# Patient Record
Sex: Male | Born: 1990 | Race: Black or African American | Hispanic: No | Smoking: Current every day smoker
Health system: Southern US, Community
[De-identification: ages and names within clinical notes are randomized; demographics above are authoritative.]

## PROBLEM LIST (undated history)

## (undated) DIAGNOSIS — F909 Attention-deficit hyperactivity disorder, unspecified type: Secondary | ICD-10-CM

## (undated) DIAGNOSIS — T7840XA Allergy, unspecified, initial encounter: Secondary | ICD-10-CM

## (undated) DIAGNOSIS — J45909 Unspecified asthma, uncomplicated: Secondary | ICD-10-CM

## (undated) HISTORY — DX: Allergy, unspecified, initial encounter: T78.40XA

## (undated) HISTORY — DX: Unspecified asthma, uncomplicated: J45.909

---

## 2010-07-09 ENCOUNTER — Emergency Department (HOSPITAL_COMMUNITY): Admission: EM | Admit: 2010-07-09 | Discharge: 2010-07-09 | Payer: Self-pay | Admitting: Emergency Medicine

## 2010-07-20 ENCOUNTER — Emergency Department (HOSPITAL_COMMUNITY): Admission: EM | Admit: 2010-07-20 | Discharge: 2010-07-20 | Payer: Self-pay | Admitting: Emergency Medicine

## 2012-11-15 ENCOUNTER — Encounter (HOSPITAL_COMMUNITY): Payer: Self-pay | Admitting: Emergency Medicine

## 2012-11-15 ENCOUNTER — Emergency Department (HOSPITAL_COMMUNITY)
Admission: EM | Admit: 2012-11-15 | Discharge: 2012-11-15 | Disposition: A | Discharge status: Home / Self Care | Payer: BC Managed Care – PPO | Attending: Emergency Medicine | Admitting: Emergency Medicine

## 2012-11-15 DIAGNOSIS — F17200 Nicotine dependence, unspecified, uncomplicated: Secondary | ICD-10-CM | POA: Insufficient documentation

## 2012-11-15 DIAGNOSIS — Z79899 Other long term (current) drug therapy: Secondary | ICD-10-CM | POA: Insufficient documentation

## 2012-11-15 DIAGNOSIS — Z8659 Personal history of other mental and behavioral disorders: Secondary | ICD-10-CM | POA: Insufficient documentation

## 2012-11-15 DIAGNOSIS — K089 Disorder of teeth and supporting structures, unspecified: Secondary | ICD-10-CM | POA: Insufficient documentation

## 2012-11-15 HISTORY — DX: Attention-deficit hyperactivity disorder, unspecified type: F90.9

## 2012-11-15 MED ORDER — HYDROCODONE-ACETAMINOPHEN 5-325 MG PO TABS: 1.0000 | ORAL_TABLET | Freq: Four times a day (QID) | ORAL | Status: DC | PRN

## 2012-11-15 NOTE — ED Notes (Signed)
Pt c/o 10/10 dental pain x2 months that has worsened 2weeks.  Pt has hx of dental infection.  Reports that teeth are sensitive to eating and drinking.  Teeth are intact.

## 2012-11-15 NOTE — ED Provider Notes (Signed)
History     CSN: [id number]  Arrival date & time 11/15/12  1158   First MD Initiated Contact with Patient 11/15/12 1302      Chief Complaint  Patient presents with  . Dental Pain    (Consider location/radiation/quality/duration/timing/severity/associated sxs/prior treatment) HPI Henry Weber is a 22 y.o. male who presents to ED with complaint of pain to his teeth. States his wisdom teeth came in and pressing on the rest of the teeth in his mouth. States that went to school doctors office, was given amoxil. States has an apt with oral surgery on the 12th of march. Has been taking ibuprofen and tylenol with no relief. Denies any swelling of the face or gums. States pain unbearable since yesterday and no relief with over the counter medications. No other complaints.    Past Medical History  Diagnosis Date  . ADHD (attention deficit hyperactivity disorder)     History reviewed. No pertinent past surgical history.  History reviewed. No pertinent family history.  History  Substance Use Topics  . Smoking status: Current Every Day Smoker  . Smokeless tobacco: Not on file  . Alcohol Use: Yes      Review of Systems  Constitutional: Negative for fever and chills.  HENT: Positive for dental problem. Negative for nosebleeds, sore throat, neck pain and neck stiffness.   Respiratory: Negative.   Cardiovascular: Negative.     Allergies  Review of patient's allergies indicates no known allergies.  Home Medications   Current Outpatient Rx  Name  Route  Sig  Dispense  Refill  . albuterol (PROVENTIL HFA;VENTOLIN HFA) 108 (90 BASE) MCG/ACT inhaler   Inhalation   Inhale 2 puffs into the lungs every 6 (six) hours as needed for wheezing.         Marland Kitchen amoxicillin (AMOXIL) 500 MG capsule   Oral   Take 500 mg by mouth 3 (three) times daily. 10 day course of therapy started 11/14/12.         . ibuprofen (ADVIL,MOTRIN) 200 MG tablet   Oral   Take 600 mg by mouth every 8 (eight) hours  as needed for pain.           BP 126/69  Pulse 94  Temp(Src) 98.3 F (36.8 C) (Oral)  Resp 20  SpO2 97%  Physical Exam  Nursing note and vitals reviewed. Constitutional: He is oriented to person, place, and time. He appears well-developed and well-nourished. No distress.  HENT:  Head: Normocephalic and atraumatic.  Nose: Nose normal.  Mouth/Throat: Oropharynx is clear and moist.  Good dentition. No obvious carries. All 4 wisdom teeth out. Tender to palpation. No surrounding swelling of the gums. No facial swelling.   Eyes: Conjunctivae are normal.  Neck: Neck supple.  Cardiovascular: Normal rate, regular rhythm and normal heart sounds.   Pulmonary/Chest: Effort normal and breath sounds normal. No respiratory distress. He has no wheezes. He has no rales.  Musculoskeletal: He exhibits no edema.  Neurological: He is alert and oriented to person, place, and time.  Skin: Skin is warm and dry.    ED Course  Procedures (including critical care time)  Labs Reviewed - No data to display No results found.   1. Pain, dental       MDM  Pt with dental pain, suspect pain from wisdom tooth pressure. Apt not until march 12th. Will give a referral to oral surgeon on call. Pt already on antibiotics. Will add pain medication. Follow up as needed.  Lottie Mussel, PA 11/15/12 1625

## 2012-11-18 NOTE — ED Provider Notes (Signed)
Medical screening examination/treatment/procedure(s) were performed by non-physician practitioner and as supervising physician I was immediately available for consultation/collaboration.  Gilda Crease, MD 11/18/12 1340

## 2014-02-27 ENCOUNTER — Ambulatory Visit (INDEPENDENT_AMBULATORY_CARE_PROVIDER_SITE_OTHER): Payer: BC Managed Care – PPO | Admitting: Emergency Medicine

## 2014-02-27 VITALS — BP 122/74 | HR 100 | Temp 98.0°F | Resp 18 | Ht 69.0 in | Wt 162.0 lb

## 2014-02-27 DIAGNOSIS — J45909 Unspecified asthma, uncomplicated: Secondary | ICD-10-CM

## 2014-02-27 MED ORDER — ALBUTEROL SULFATE HFA 108 (90 BASE) MCG/ACT IN AERS: 2.0000 | INHALATION_SPRAY | RESPIRATORY_TRACT | Status: AC | PRN

## 2014-02-27 NOTE — Patient Instructions (Signed)
Asthma, Adult Asthma is a recurring condition in which the airways tighten and narrow. Asthma can make it difficult to breathe. It can cause coughing, wheezing, and shortness of breath. Asthma episodes (also called asthma attacks) range from minor to life-threatening. Asthma cannot be cured, but medicines and lifestyle changes can help control it. CAUSES Asthma is believed to be caused by inherited (genetic) and environmental factors, but its exact cause is unknown. Asthma may be triggered by allergens, lung infections, or irritants in the air. Asthma triggers are different for each person. Common triggers include:   Animal dander.  Dust mites.  Cockroaches.  Pollen from trees or grass.  Mold.  Smoke.  Air pollutants such as dust, household cleaners, hair sprays, aerosol sprays, paint fumes, strong chemicals, or strong odors.  Cold air, weather changes, and winds (which increase molds and pollens in the air).  Strong emotional expressions such as crying or laughing hard.  Stress.  Certain medicines (such as aspirin) or types of drugs (such as beta-blockers).  Sulfites in foods and drinks. Foods and drinks that may contain sulfites include dried fruit, potato chips, and sparkling grape juice.  Infections or inflammatory conditions such as the flu, a cold, or an inflammation of the nasal membranes (rhinitis).  Gastroesophageal reflux disease (GERD).  Exercise or strenuous activity. SYMPTOMS Symptoms may occur immediately after asthma is triggered or many hours later. Symptoms include:  Wheezing.  Excessive nighttime or early morning coughing.  Frequent or severe coughing with a common cold.  Chest tightness.  Shortness of breath. DIAGNOSIS  The diagnosis of asthma is made by a review of your medical history and a physical exam. Tests may also be performed. These may include:  Lung function studies. These tests show how much air you breath in and out.  Allergy  tests.  Imaging tests such as X-rays. TREATMENT  Asthma cannot be cured, but it can usually be controlled. Treatment involves identifying and avoiding your asthma triggers. It also involves medicines. There are 2 classes of medicine used for asthma treatment:   Controller medicines. These prevent asthma symptoms from occurring. They are usually taken every day.  Reliever or rescue medicines. These quickly relieve asthma symptoms. They are used as needed and provide short-term relief. Your health care provider will help you create an asthma action plan. An asthma action plan is a written plan for managing and treating your asthma attacks. It includes a list of your asthma triggers and how they may be avoided. It also includes information on when medicines should be taken and when their dosage should be changed. An action plan may also involve the use of a device called a peak flow meter. A peak flow meter measures how well the lungs are working. It helps you monitor your condition. HOME CARE INSTRUCTIONS   Take medicine as directed by your health care provider. Speak with your health care provider if you have questions about how or when to take the medicines.  Use a peak flow meter as directed by your health care provider. Record and keep track of readings.  Understand and use the action plan to help minimize or stop an asthma attack without needing to seek medical care.  Control your home environment in the following ways to help prevent asthma attacks:  Do not smoke. Avoid being exposed to secondhand smoke.  Change your heating and air conditioning filter regularly.  Limit your use of fireplaces and wood stoves.  Get rid of pests (such as roaches and   mice) and their droppings.  Throw away plants if you see mold on them.  Clean your floors and dust regularly. Use unscented cleaning products.  Try to have someone else vacuum for you regularly. Stay out of rooms while they are being  vacuumed and for a short while afterward. If you vacuum, use a dust mask from a hardware store, a double-layered or microfilter vacuum cleaner bag, or a vacuum cleaner with a HEPA filter.  Replace carpet with wood, tile, or vinyl flooring. Carpet can trap dander and dust.  Use allergy-proof pillows, mattress covers, and box spring covers.  Wash bed sheets and blankets every week in hot water and dry them in a dryer.  Use blankets that are made of polyester or cotton.  Clean bathrooms and kitchens with bleach. If possible, have someone repaint the walls in these rooms with mold-resistant paint. Keep out of the rooms that are being cleaned and painted.  Wash hands frequently. SEEK MEDICAL CARE IF:   You have wheezing, shortness of breath, or a cough even if taking medicine to prevent attacks.  The colored mucus you cough up (sputum) is thicker than usual.  Your sputum changes from clear or white to yellow, green, gray, or bloody.  You have any problems that may be related to the medicines you are taking (such as a rash, itching, swelling, or trouble breathing).  You are using a reliever medicine more than 2 3 times per week.  Your peak flow is still at 50 79% of you personal best after following your action plan for 1 hour. SEEK IMMEDIATE MEDICAL CARE IF:   You seem to be getting worse and are unresponsive to treatment during an asthma attack.  You are short of breath even at rest.  You get short of breath when doing very little physical activity.  You have difficulty eating, drinking, or talking due to asthma symptoms.  You develop chest pain.  You develop a fast heartbeat.  You have a bluish color to your lips or fingernails.  You are lightheaded, dizzy, or faint.  Your peak flow is less than 50% of your personal best.  You have a fever or persistent symptoms for more than 2 3 days.  You have a fever and symptoms suddenly get worse. MAKE SURE YOU:   Understand these  instructions.  Will watch your condition.  Will get help right away if you are not doing well or get worse. Document Released: 09/05/2005 Document Revised: 05/08/2013 Document Reviewed: 04/04/2013 [employer] Patient Information 2014 [employer].  

## 2014-02-27 NOTE — Progress Notes (Signed)
[facility] [address], [place] [address] [phone]  Date:  02/27/2014   Name:  Henry Weber   DOB:  1991-02-07   MRN:  [id number]  PCP:  No PCP Per Patient    Chief Complaint: Asthma   History of Present Illness:  Henry Weber is a 23 y.o. very pleasant male patient who presents with the following:  History of asthma and is finding it more difficult without an inhaler.  Last attack requiring medical attention was 5 years ago.  No improvement with over the counter medications or other home remedies. Denies other complaint or health concern today.   There are no active problems to display for this patient.   Past Medical History  Diagnosis Date  . ADHD (attention deficit hyperactivity disorder)   . Allergy   . Asthma     No past surgical history on file.  History  Substance Use Topics  . Smoking status: Current Every Day Smoker  . Smokeless tobacco: Not on file  . Alcohol Use: No    Family History  Problem Relation Age of Onset  . Cancer Mother     No Known Allergies  Medication list has been reviewed and updated.  Current Outpatient Prescriptions on File Prior to Visit  Medication Sig Dispense Refill  . albuterol (PROVENTIL HFA;VENTOLIN HFA) 108 (90 BASE) MCG/ACT inhaler Inhale 2 puffs into the lungs every 6 (six) hours as needed for wheezing.       No current facility-administered medications on file prior to visit.    Review of Systems:  As per HPI, otherwise negative.    Physical Examination: Filed Vitals:   02/27/14 1636  BP: 122/74  Pulse: 100  Temp: 98 F (36.7 C)  Resp: 18   Filed Vitals:   02/27/14 1636  Height: 5\' 9"  (1.753 m)  Weight: 162 lb (73.483 kg)   Body mass index is 23.91 kg/(m^2). Ideal Body Weight: Weight in (lb) to have BMI = 25: 168.9   GEN: WDWN, NAD, Non-toxic, Alert & Oriented x 3 HEENT: Atraumatic, Normocephalic.  Ears and Nose: No external deformity. EXTR: No  clubbing/cyanosis/edema NEURO: Normal gait.  PSYCH: Normally interactive. Conversant. Not depressed or anxious appearing.  Calm demeanor.  Chest:  Clear, BS=  Assessment and Plan: Asthma Albuterol Stop smoking  Signed,  Phillips Odor, MD

## 2015-08-23 ENCOUNTER — Ambulatory Visit (INDEPENDENT_AMBULATORY_CARE_PROVIDER_SITE_OTHER): Payer: BLUE CROSS/BLUE SHIELD | Admitting: Emergency Medicine

## 2015-08-23 VITALS — BP 116/80 | HR 92 | Temp 98.5°F | Resp 18 | Ht 70.0 in | Wt 184.0 lb

## 2015-08-23 DIAGNOSIS — K648 Other hemorrhoids: Secondary | ICD-10-CM

## 2015-08-23 DIAGNOSIS — K6289 Other specified diseases of anus and rectum: Secondary | ICD-10-CM

## 2015-08-23 DIAGNOSIS — K644 Residual hemorrhoidal skin tags: Secondary | ICD-10-CM

## 2015-08-23 NOTE — Progress Notes (Signed)
Subjective:  Patient ID: Henry Weber, male    DOB: 1991-08-03  Age: 24 y.o. MRN: [id number]  CC: Hemorrhoids and Rash   HPI Maurice Bisig presents  a painful mass on his right anus or at least 10 days. He has tried a number of over-the-counter preparations with no improvement and otherwise negative for rash on his inner gluteal region. He denies any blood in stool black stool. Fever chills or other complaints  History Trevis has a past medical history of ADHD (attention deficit hyperactivity disorder); Allergy; and Asthma.   He has no past surgical history on file.   His  family history includes Cancer in his mother.  He   reports that he has been smoking.  He does not have any smokeless tobacco history on file. He reports that he uses illicit drugs (Marijuana). He reports that he does not drink alcohol.  Outpatient Prescriptions Prior to Visit  Medication Sig Dispense Refill  . albuterol (PROVENTIL HFA;VENTOLIN HFA) 108 (90 BASE) MCG/ACT inhaler Inhale 2 puffs into the lungs every 4 (four) hours as needed for wheezing or shortness of breath (cough, shortness of breath or wheezing.). 1 Inhaler 12  . albuterol (PROVENTIL HFA;VENTOLIN HFA) 108 (90 BASE) MCG/ACT inhaler Inhale 2 puffs into the lungs every 6 (six) hours as needed for wheezing.     No facility-administered medications prior to visit.    Social History   Social History  . Marital Status: Single    Spouse Name: N/A  . Number of Children: N/A  . Years of Education: N/A   Social History Main Topics  . Smoking status: Current Every Day Smoker  . Smokeless tobacco: None  . Alcohol Use: No  . Drug Use: Yes    Special: Marijuana  . Sexual Activity: Not Asked   Other Topics Concern  . None   Social History Narrative     Review of Systems  Constitutional: Negative for fever, chills and appetite change.  HENT: Negative for congestion, ear pain, postnasal drip, sinus pressure and sore throat.   Eyes:  Negative for pain and redness.  Respiratory: Negative for cough, shortness of breath and wheezing.   Cardiovascular: Negative for leg swelling.  Gastrointestinal: Negative for nausea, vomiting, abdominal pain, diarrhea, constipation and blood in stool.  Endocrine: Negative for polyuria.  Genitourinary: Negative for dysuria, urgency, frequency and flank pain.  Musculoskeletal: Negative for gait problem.  Skin: Negative for rash.  Neurological: Negative for weakness and headaches.  Psychiatric/Behavioral: Negative for confusion and decreased concentration. The patient is not nervous/anxious.     Objective:  BP 116/80 mmHg  Pulse 92  Temp(Src) 98.5 F (36.9 C) (Oral)  Resp 18  Ht  (1.778 m)  Wt 184 lb (83.462 kg)  BMI 26.40 kg/m2  SpO2 98%  Physical Exam  Constitutional: He is oriented to person, place, and time. He appears well-developed and well-nourished.  HENT:  Head: Normocephalic and atraumatic.  Eyes: Conjunctivae are normal. Pupils are equal, round, and reactive to light.  Pulmonary/Chest: Effort normal.  Musculoskeletal: He exhibits no edema.  Neurological: He is alert and oriented to person, place, and time.  Skin: Skin is dry.  Psychiatric: He has a normal mood and affect. His behavior is normal. Thought content normal.    patient has a painful mass on his anus  He is under theimpression he has a thrombosed external hemorrhoid. Using a number of over-the-counter products on hiLynda Rainwaterid with no improvement. Lesion was not seen to have a  clot. Was pedunculated stalk about a half a centimeter length with a lima bean size hemorrhagic appearing but not thrombosed external hemorrhoid   Assessment & Plan:   Ovidio KinSpenser was seen today for hemorrhoids and rash.  Diagnoses and all orders for this visit:  External hemorrhoid -     Surgical pathology  Rectal pain   I am having Mr. Earlene PlaterDavis maintain his albuterol.  No orders of the defined types were placed in this  encounter.    patient was offered number of treatment options including referral to a colorectal surgeon. He asked that we amputate the lesion so he was treated with was prepped with Betadine and the stalk of the lesion was infiltrated lidocaine after which the stalk was clamped and then a 30 Vicryl suture ligature was used for hemostasis and the mass amputated and sent for pathology and tolerated the procedure well  Appropriate red flag conditions were discussed with the patient as well as actions that should be taken.  Patient expressed his understanding.  Follow-up: Return in about 1 week (around 08/30/2015).  Carmelina DaneAnderson, Wanya Bangura S, MD

## 2015-08-23 NOTE — Patient Instructions (Signed)

## 2015-10-08 ENCOUNTER — Ambulatory Visit (INDEPENDENT_AMBULATORY_CARE_PROVIDER_SITE_OTHER): Payer: BLUE CROSS/BLUE SHIELD | Admitting: Family Medicine

## 2015-10-08 VITALS — BP 122/74 | HR 86 | Temp 98.7°F | Resp 17 | Ht 69.5 in | Wt 184.0 lb

## 2015-10-08 DIAGNOSIS — L259 Unspecified contact dermatitis, unspecified cause: Secondary | ICD-10-CM | POA: Diagnosis not present

## 2015-10-08 DIAGNOSIS — L302 Cutaneous autosensitization: Secondary | ICD-10-CM | POA: Diagnosis not present

## 2015-10-08 MED ORDER — TRIAMCINOLONE ACETONIDE 0.1 % EX CREA: 1.0000 "application " | TOPICAL_CREAM | Freq: Two times a day (BID) | CUTANEOUS | Status: DC

## 2015-10-08 MED ORDER — PREDNISONE 20 MG PO TABS: 20.0000 mg | ORAL_TABLET | Freq: Every day | ORAL | Status: DC

## 2015-10-08 NOTE — Patient Instructions (Addendum)
It appears that you have a contact dermatitis in the lower abdomen area from nickel or other metal in belts or buckles. Avoid contact of the skin directly to metal buckle or belt with techniques as discussed. You can apply the triamcinolone cream to that area and other itchy areas as needed twice per day until symptoms have improved. If you are having to use the cream for more than the next 2-3 weeks, return for recheck. If any spread of rash or worsening symptoms including fever or rash in mouth or genital area, return to clinic right away. I also wrote for prednisone to take for the next 5 days to help with the current reaction, and take Zyrtec or Allegra once per day until ras improved. Benadryl once at night if needed.  Return to the clinic or go to the nearest emergency room if any of your symptoms worsen or new symptoms occur.  Contact Dermatitis Dermatitis is redness, soreness, and swelling (inflammation) of the skin. Contact dermatitis is a reaction to certain substances that touch the skin. There are two types of contact dermatitis:   Irritant contact dermatitis. This type is caused by something that irritates your skin, such as dry hands from washing them too much. This type does not require previous exposure to the substance for a reaction to occur. This type is more common.  Allergic contact dermatitis. This type is caused by a substance that you are allergic to, such as a nickel allergy or poison ivy. This type only occurs if you have been exposed to the substance (allergen) before. Upon a repeat exposure, your body reacts to the substance. This type is less common. CAUSES  Many different substances can cause contact dermatitis. Irritant contact dermatitis is most commonly caused by exposure to:   Makeup.   Soaps.   Detergents.   Bleaches.   Acids.   Metal salts, such as nickel.  Allergic contact dermatitis is most commonly caused by exposure to:   Poisonous plants.    Chemicals.   Jewelry.   Latex.   Medicines.   Preservatives in products, such as clothing.  RISK FACTORS This condition is more likely to develop in:   People who have jobs that expose them to irritants or allergens.  People who have certain medical conditions, such as asthma or eczema.  SYMPTOMS  Symptoms of this condition may occur anywhere on your body where the irritant has touched you or is touched by you. Symptoms include:  Dryness or flaking.   Redness.   Cracks.   Itching.   Pain or a burning feeling.   Blisters.  Drainage of small amounts of blood or clear fluid from skin cracks. With allergic contact dermatitis, there may also be swelling in areas such as the eyelids, mouth, or genitals.  DIAGNOSIS  This condition is diagnosed with a medical history and physical exam. A patch skin test may be performed to help determine the cause. If the condition is related to your job, you may need to see an occupational medicine specialist. TREATMENT Treatment for this condition includes figuring out what caused the reaction and protecting your skin from further contact. Treatment may also include:   Steroid creams or ointments. Oral steroid medicines may be needed in more severe cases.  Antibiotics or antibacterial ointments, if a skin infection is present.  Antihistamine lotion or an antihistamine taken by mouth to ease itching.  A bandage (dressing). HOME CARE INSTRUCTIONS Skin Care  Moisturize your skin as needed.  Apply cool compresses to the affected areas.  Try taking a bath with:  Epsom salts. Follow the instructions on the packaging. You can get these at your local pharmacy or grocery store.  Baking soda. Pour a small amount into the bath as directed by your health care provider.  Colloidal oatmeal. Follow the instructions on the packaging. You can get this at your local pharmacy or grocery store.  Try applying baking soda paste to  your skin. Stir water into baking soda until it reaches a paste-like consistency.  Do not scratch your skin.  Bathe less frequently, such as every other day.  Bathe in lukewarm water. Avoid using hot water. Medicines  Take or apply over-the-counter and prescription medicines only as told by your health care provider.   If you were prescribed an antibiotic medicine, take or apply your antibiotic as told by your health care provider. Do not stop using the antibiotic even if your condition starts to improve. General Instructions  Keep all follow-up visits as told by your health care provider. This is important.  Avoid the substance that caused your reaction. If you do not know what caused it, keep a journal to try to track what caused it. Write down:  What you eat.  What cosmetic products you use.  What you drink.  What you wear in the affected area. This includes jewelry.  If you were given a dressing, take care of it as told by your health care provider. This includes when to change and remove it. SEEK MEDICAL CARE IF:   Your condition does not improve with treatment.  Your condition gets worse.  You have signs of infection such as swelling, tenderness, redness, soreness, or warmth in the affected area.  You have a fever.  You have new symptoms. SEEK IMMEDIATE MEDICAL CARE IF:   You have a severe headache, neck pain, or neck stiffness.  You vomit.  You feel very sleepy.  You notice red streaks coming from the affected area.  Your bone or joint underneath the affected area becomes painful after the skin has healed.  The affected area turns darker.  You have difficulty breathing.   This information is not intended to replace advice given to you by your health care provider. Make sure you discuss any questions you have with your health care provider.   Document Released: 09/02/2000 Document Revised: 05/27/2015 Document Reviewed: 01/21/2015 [employer] Interactive  Patient Education [employer]

## 2015-10-08 NOTE — Progress Notes (Signed)
Subjective:    Patient ID: Henry Weber, male    DOB: 25-Mar-1991, 25 y.o.   MRN: [id number] By signing my name below, I, Littie Deeds, attest that this documentation has been prepared under the direction and in the presence of Meredith Staggers, MD.  Electronically Signed: Littie Deeds, Medical Scribe. 10/08/2015. 2:15 PM.  HPI HPI Comments: Henry Weber is a 25 y.o. male who presents [facility] the [facility] complaining of gradual onset, pruritic rash to his abdomen that started a few months ago. Patient believes the rash is due to nickel in an old belt he had been using. He has since replaced the belt with one that does not contain nickel. The rash has since improved, but has not resolved yet. He has been using a steroid cream that did provide relief initially, but has not been as effective more recently. Patient denies genital rash, mouth sores, SOB and sore throat.  Patient also reports having a pruritic rash to his bilateral arms and back, extending into his upper buttocks, that started about a week ago. He has been taking Benadryl the past 2 days with mild relief. He notes that he recently started using Eucerin lotion, which he had not used previously. He also notes that he recently washed his work clothes with an ink pen accidentally, but he washed the clothes a second time. Patient denies new foods, new colognes, new detergent/softener, and other new things.   Patient works at the [employer].  There are no active problems to display for this patient.  Past Medical History  Diagnosis Date  . ADHD (attention deficit hyperactivity disorder)   . Allergy   . Asthma    No past surgical history on file. No Known Allergies Prior to Admission medications   Medication Sig Start Date End Date Taking? Authorizing Provider  albuterol (PROVENTIL HFA;VENTOLIN HFA) 108 (90 BASE) MCG/ACT inhaler Inhale 2 puffs into the lungs every 4 (four) hours as needed for wheezing or  shortness of breath (cough, shortness of breath or wheezing.). 02/27/14  Yes Carmelina Dane, MD   Social History   Social History  . Marital Status: Single    Spouse Name: N/A  . Number of Children: N/A  . Years of Education: N/A   Occupational History  . Not on file.   Social History Main Topics  . Smoking status: Current Every Day Smoker -- 0.20 packs/day for 7 years    Types: Cigarettes  . Smokeless tobacco: Not on file  . Alcohol Use: No  . Drug Use: Yes    Special: Marijuana  . Sexual Activity: Not on file   Other Topics Concern  . Not on file   Social History Narrative     Review of Systems  HENT: Negative for mouth sores and sore throat.   Respiratory: Negative for shortness of breath.   Skin: Positive for rash.       Objective:   Physical Exam  Constitutional: He is oriented to person, place, and time. He appears well-developed and well-nourished. No distress.  HENT:  Head: Normocephalic and atraumatic.  Mouth/Throat: Oropharynx is clear and moist. No oropharyngeal exudate.  Moist oral mucosa. No oral lesions.  Eyes: Pupils are equal, round, and reactive to light.  Neck: Neck supple.  Cardiovascular: Normal rate.   Pulmonary/Chest: Effort normal. No stridor. He has no wheezes.  Clear to auscultation bilaterally.   Musculoskeletal: He exhibits no edema.  Neurological: He is alert and oriented to person, place,  and time. No cranial nerve deficit.  Skin: Skin is warm and dry. Rash noted.  Patches of faint papules down the bilateral, upper greater than lower arms. Spares the hands and palms. No rash between the fingers. Same type of rash to lower back, left and right, with confluence of papules, some excoriated.  Lower abdomen with hyperpigmented patch that measures approximately 15 cm x 6 cm, most notable at the infraumbilical area.  Psychiatric: He has a normal mood and affect. His behavior is normal.  Nursing note and vitals reviewed.   Filed Vitals:    10/08/15 1344  BP: 122/74  Pulse: 86  Temp: 98.7 F (37.1 C)  TempSrc: Oral  Resp: 17  Height: 5' 9.5" (1.765 m)  Weight: 184 lb (83.462 kg)  SpO2: 99%        Assessment & Plan:   Henry Weber is a 25 y.o. male Contact dermatitis - Plan: predniSONE (DELTASONE) 20 MG tablet, triamcinolone cream (KENALOG) 0.1 %  Id reaction - Plan: predniSONE (DELTASONE) 20 MG tablet, triamcinolone cream (KENALOG) 0.1 %   -Suspected nickel/contact dermatitis on lower abdomen from belt or buckle, which preceded the upper extremity and flank rash. This may be an ID reaction to the initial contact dermatitis of his lower belt line. No other systemic symptoms, nontoxic, afebrile.  -Short course of prednisone po for 5 days (side effects discussed), triamcinolone topical, counter Zyrtec or Allegra then Benadryl nightly if needed.  -RTC if worsening, or not improving within the next few weeks.  Meds ordered this encounter  Medications  . predniSONE (DELTASONE) 20 MG tablet    Sig: Take 1 tablet (20 mg total) by mouth daily with breakfast.    Dispense:  10 tablet    Refill:  0  . triamcinolone cream (KENALOG) 0.1 %    Sig: Apply 1 application topically 2 (two) times daily.    Dispense:  30 g    Refill:  0   Patient Instructions  It appears that you have a contact dermatitis in the lower abdomen area from nickel or other metal in belts or buckles. Avoid contact of the skin directly to metal buckle or belt with techniques as discussed. You can apply the triamcinolone cream to that area and other itchy areas as needed twice per day until symptoms have improved. If you are having to use the cream for more than the next 2-3 weeks, return for recheck. If any spread of rash or worsening symptoms including fever or rash in mouth or genital area, return to clinic right away. I also wrote for prednisone to take for the next 5 days to help with the current reaction, and take Zyrtec or Allegra once per day until ras  improved. Benadryl once at night if needed.  Return to the clinic or go to the nearest emergency room if any of your symptoms worsen or new symptoms occur.  Contact Dermatitis Dermatitis is redness, soreness, and swelling (inflammation) of the skin. Contact dermatitis is a reaction to certain substances that touch the skin. There are two types of contact dermatitis:   Irritant contact dermatitis. This type is caused by something that irritates your skin, such as dry hands from washing them too much. This type does not require previous exposure to the substance for a reaction to occur. This type is more common.  Allergic contact dermatitis. This type is caused by a substance that you are allergic to, such as a nickel allergy or poison ivy. This type only occurs  if you have been exposed to the substance (allergen) before. Upon a repeat exposure, your body reacts to the substance. This type is less common. CAUSES  Many different substances can cause contact dermatitis. Irritant contact dermatitis is most commonly caused by exposure to:   Makeup.   Soaps.   Detergents.   Bleaches.   Acids.   Metal salts, such as nickel.  Allergic contact dermatitis is most commonly caused by exposure to:   Poisonous plants.   Chemicals.   Jewelry.   Latex.   Medicines.   Preservatives in products, such as clothing.  RISK FACTORS This condition is more likely to develop in:   People who have jobs that expose them to irritants or allergens.  People who have certain medical conditions, such as asthma or eczema.  SYMPTOMS  Symptoms of this condition may occur anywhere on your body where the irritant has touched you or is touched by you. Symptoms include:  Dryness or flaking.   Redness.   Cracks.   Itching.   Pain or a burning feeling.   Blisters.  Drainage of small amounts of blood or clear fluid from skin cracks. With allergic contact dermatitis, there may also be  swelling in areas such as the eyelids, mouth, or genitals.  DIAGNOSIS  This condition is diagnosed with a medical history and physical exam. A patch skin test may be performed to help determine the cause. If the condition is related to your job, you may need to see an occupational medicine specialist. TREATMENT Treatment for this condition includes figuring out what caused the reaction and protecting your skin from further contact. Treatment may also include:   Steroid creams or ointments. Oral steroid medicines may be needed in more severe cases.  Antibiotics or antibacterial ointments, if a skin infection is present.  Antihistamine lotion or an antihistamine taken by mouth to ease itching.  A bandage (dressing). HOME CARE INSTRUCTIONS Skin Care  Moisturize your skin as needed.   Apply cool compresses to the affected areas.  Try taking a bath with:  Epsom salts. Follow the instructions on the packaging. You can get these at your local pharmacy or grocery store.  Baking soda. Pour a small amount into the bath as directed by your health care provider.  Colloidal oatmeal. Follow the instructions on the packaging. You can get this at your local pharmacy or grocery store.  Try applying baking soda paste to your skin. Stir water into baking soda until it reaches a paste-like consistency.  Do not scratch your skin.  Bathe less frequently, such as every other day.  Bathe in lukewarm water. Avoid using hot water. Medicines  Take or apply over-the-counter and prescription medicines only as told by your health care provider.   If you were prescribed an antibiotic medicine, take or apply your antibiotic as told by your health care provider. Do not stop using the antibiotic even if your condition starts to improve. General Instructions  Keep all follow-up visits as told by your health care provider. This is important.  Avoid the substance that caused your reaction. If you do not  know what caused it, keep a journal to try to track what caused it. Write down:  What you eat.  What cosmetic products you use.  What you drink.  What you wear in the affected area. This includes jewelry.  If you were given a dressing, take care of it as told by your health care provider. This includes when to change and  remove it. SEEK MEDICAL CARE IF:   Your condition does not improve with treatment.  Your condition gets worse.  You have signs of infection such as swelling, tenderness, redness, soreness, or warmth in the affected area.  You have a fever.  You have new symptoms. SEEK IMMEDIATE MEDICAL CARE IF:   You have a severe headache, neck pain, or neck stiffness.  You vomit.  You feel very sleepy.  You notice red streaks coming from the affected area.  Your bone or joint underneath the affected area becomes painful after the skin has healed.  The affected area turns darker.  You have difficulty breathing.   This information is not intended to replace advice given to you by your health care provider. Make sure you discuss any questions you have with your health care provider.   Document Released: 09/02/2000 Document Revised: 05/27/2015 Document Reviewed: 01/21/2015 Elsevier Interactive Patient Education [employer]     I personally performed the services described in this documentation, which was scribed in my presence. The recorded information has been reviewed and considered, and addended by me as needed.

## 2015-10-21 ENCOUNTER — Other Ambulatory Visit: Payer: Self-pay | Admitting: Family Medicine

## 2015-10-24 ENCOUNTER — Other Ambulatory Visit: Payer: Self-pay | Admitting: Family Medicine

## 2015-12-28 ENCOUNTER — Ambulatory Visit (INDEPENDENT_AMBULATORY_CARE_PROVIDER_SITE_OTHER): Payer: BLUE CROSS/BLUE SHIELD | Admitting: Family Medicine

## 2015-12-28 VITALS — BP 140/76 | HR 72 | Temp 99.4°F | Resp 16 | Ht 69.5 in | Wt 187.0 lb

## 2015-12-28 DIAGNOSIS — J069 Acute upper respiratory infection, unspecified: Secondary | ICD-10-CM | POA: Diagnosis not present

## 2015-12-28 DIAGNOSIS — R059 Cough, unspecified: Secondary | ICD-10-CM

## 2015-12-28 DIAGNOSIS — R6889 Other general symptoms and signs: Secondary | ICD-10-CM

## 2015-12-28 DIAGNOSIS — R05 Cough: Secondary | ICD-10-CM | POA: Diagnosis not present

## 2015-12-28 LAB — POCT INFLUENZA A/B
INFLUENZA A, POC: NEGATIVE
INFLUENZA B, POC: NEGATIVE

## 2015-12-28 MED ORDER — BENZONATATE 100 MG PO CAPS: 100.0000 mg | ORAL_CAPSULE | Freq: Three times a day (TID) | ORAL | Status: DC | PRN

## 2015-12-28 NOTE — Patient Instructions (Addendum)
Drink plenty of fluids and get enough rest  Advise quitting smoking as discussed  Take the benzonatate cough pills one or 2 pills 3 times daily as needed for daytime cough. This is nonsedating.  Take the DM-containing cough syrup (Robitussin-DM, Mucinex DM, Tussend DM, etc.)  Take an over-the-counter antihistamine decongestant as needed for head congestion such as Claritin-D, Allegra-D, or Zyrtec-D (loratadine D, fexofenadine D, or cetirizine D)  Return if worse  Stay off work for the next several days as discussed  IF you received an x-ray today, you will receive an invoice from [facility]. Please contact [facility] at [phone] with questions or concerns regarding your invoice.   IF you received labwork today, you will receive an invoice from [facility]. Please contact [facility] at [phone] with questions or concerns regarding your invoice.   Our billing staff will not be able to assist you with questions regarding bills from these companies.  You will be contacted with the lab results as soon as they are available. The fastest way to get your results is to activate your My Chart account. Instructions are located on the last page of this paperwork. If you have not heard from us regarding the results in 2 weeks, please contact this office.

## 2015-12-28 NOTE — Progress Notes (Signed)
Patient ID: Henry Weber, male    DOB: Mar 14, 1991  Age: 25 y.o. MRN: [id number]  Chief Complaint  Patient presents with  . Fever  . Sore Throat  . Fatigue  . Dizziness  . Back Pain  . Cough    Productive  . Nasal Congestion    Subjective:   25 year old man who had some initial minimal symptoms on Friday night with a little headache and back aching. Saturday he was worse in the evening, with some fever and cough started developing. This subsided again for worsening persist with symptoms. He has had cough, and congestion, headache and back pain and some body aches. He does smoke about a third of pack of cigarettes a day. He did not get a flu shot. He is generally a healthy man.  Current allergies, medications, problem list, past/family and social histories reviewed.  Objective:  BP 140/76 mmHg  Pulse 72  Temp(Src) 99.4 F (37.4 C) (Oral)  Resp 16  Ht 5' 9.5" (1.765 m)  Wt 187 lb (84.823 kg)  BMI 27.23 kg/m2  SpO2 96%  No major distress. His TMs are normal. Nose congested. Throat clear. Neck supple without significant nodes. Chest clear to auscultation. Some coughing. Heart regular without murmur.  Assessment & Plan:   Assessment: 1. Flu-like symptoms   2. Acute upper respiratory infection   3. Cough       Plan: Flu swab  Results for orders placed or performed in visit on 12/28/15  POCT Influenza A/B  Result Value Ref Range   Influenza A, POC Negative Negative   Influenza B, POC Negative Negative     Patient Instructions   Drink plenty of fluids and get enough rest  Advise quitting smoking as discussed  Take the benzonatate cough pills one or 2 pills 3 times daily as needed for daytime cough. This is nonsedating.  Take the DM-containing cough syrup (Robitussin-DM, Mucinex DM, Tussend DM, etc.)  Take an over-the-counter antihistamine decongestant as needed for head congestion such as Claritin-D, Allegra-D, or Zyrtec-D (loratadine D, fexofenadine D, or  cetirizine D)  Return if worse  Stay off work for the next several days as discussed  IF you received an x-ray today, you will receive an invoice from [facility]. Please contact [facility] at [phone] with questions or concerns regarding your invoice.   IF you received labwork today, you will receive an invoice from [facility]. Please contact [facility] at [phone] with questions or concerns regarding your invoice.   Our billing staff will not be able to assist you with questions regarding bills from these companies.  You will be contacted with the lab results as soon as they are available. The fastest way to get your results is to activate your My Chart account. Instructions are located on the last page of this paperwork. If you have not heard from us regarding the results in 2 weeks, please contact this office.         Return if symptoms worsen or fail to improve.   Trevonne Nyland, MD 12/28/2015

## 2016-11-22 ENCOUNTER — Ambulatory Visit (INDEPENDENT_AMBULATORY_CARE_PROVIDER_SITE_OTHER): Payer: BLUE CROSS/BLUE SHIELD | Admitting: Physician Assistant

## 2016-11-22 VITALS — BP 120/72 | HR 76 | Temp 98.1°F | Resp 18 | Ht 69.5 in | Wt 164.6 lb

## 2016-11-22 DIAGNOSIS — R519 Headache, unspecified: Secondary | ICD-10-CM

## 2016-11-22 DIAGNOSIS — R634 Abnormal weight loss: Secondary | ICD-10-CM

## 2016-11-22 DIAGNOSIS — R51 Headache: Secondary | ICD-10-CM

## 2016-11-22 MED ORDER — SULFAMETHOXAZOLE-TRIMETHOPRIM 800-160 MG PO TABS: 1.0000 | ORAL_TABLET | Freq: Two times a day (BID) | ORAL | 0 refills | Status: AC

## 2016-11-22 MED ORDER — MELOXICAM 15 MG PO TABS: 15.0000 mg | ORAL_TABLET | Freq: Every day | ORAL | 1 refills | Status: DC

## 2016-11-22 MED ORDER — ACETIC ACID 2 % OT SOLN: 4.0000 [drp] | Freq: Three times a day (TID) | OTIC | 0 refills | Status: DC

## 2016-11-22 NOTE — Patient Instructions (Addendum)
After review, it is possible that your symptoms are infectious and possible musculature. Please take the antibiotics as prescribed, to treat possible ear infection, and the mobic, for muscle strain.  Do not take mobic with naproxen or ibuprofen.  Perform neck stretches as discussed, and ice directly after.   If your symptoms do not improve within the next 2 weeks, I need you to return.  I will have your lab results shortly.     IF you received an x-ray today, you will receive an invoice from [facility]. Please contact [facility] at [phone] with questions or concerns regarding your invoice.   IF you received labwork today, you will receive an invoice from [facility]. Please contact [facility] at [phone] with questions or concerns regarding your invoice.   Our billing staff will not be able to assist you with questions regarding bills from these companies.  You will be contacted with the lab results as soon as they are available. The fastest way to get your results is to activate your My Chart account. Instructions are located on the last page of this paperwork. If you have not heard from us regarding the results in 2 weeks, please contact this office.

## 2016-11-22 NOTE — Progress Notes (Signed)
[facility] [address], [place] [address] [phone]  Date:  11/22/2016   Name:  Henry RainwaterSpenser Weber   DOB:  08-06-1991   MRN:  [id number]  PCP:  No PCP Per Patient    History of Present Illness:  Henry Weber is a 26 y.o. male patient who [facility] for bump on back or his head and weight loss.   --left sided headache, 9 days ago.  First noticed a knot on the back of the left side of his head that was not tender.  By the next day, it became painful for 1 day with character described as an intermittent pressure pain.  Touching in that area could be very painful.  3rd day, pain resolved, but he down, and then comes back on the right side.  Temperature 96.5, sweating.  There is no nausea, no photophobia, phonophobia, dizziness, no change in vision.   --seasonal allergies 1-2 weeks ago.  No facial pressure.  No neck stiffness.  No family hx of head pain, or malignancy.  No family hx of diabetes.  He is hydrating with 2-4 cups.  No coughing.  Unprotected.   --he has dropped 15lbs over the 1.5 month.  He was moving in the last month.  His diet had changed to vegetarian about 6 months ago.  He has recalled missing lunches over the last month.  Portions are generally smaller.  He eats a lot of nuts, and protein supplement.  --he went to an urgent care 7 days ago.  She did not do any blood work.   --moved to the [address] story apt  Wt Readings from Last 3 Encounters:  11/22/16 164 lb 9.6 oz (74.7 kg)  12/28/15 187 lb (84.8 kg)  10/08/15 184 lb (83.5 kg)    There are no active problems to display for this patient.   Past Medical History:  Diagnosis Date  . ADHD (attention deficit hyperactivity disorder)   . Allergy   . Asthma     No past surgical history on file.  Social History  Substance Use Topics  . Smoking status: Current Every Day Smoker    Packs/day: 0.20    Years: 7.00    Types: Cigarettes  . Smokeless tobacco: Never Used  . Alcohol use No     Family History  Problem Relation Age of Onset  . Cancer Mother     No Known Allergies  Medication list has been reviewed and updated.  Current Outpatient Prescriptions on File Prior to Visit  Medication Sig Dispense Refill  . albuterol (PROVENTIL HFA;VENTOLIN HFA) 108 (90 BASE) MCG/ACT inhaler Inhale 2 puffs into the lungs every 4 (four) hours as needed for wheezing or shortness of breath (cough, shortness of breath or wheezing.). 1 Inhaler 12  . benzonatate (TESSALON) 100 MG capsule Take 1-2 capsules (100-200 mg total) by mouth 3 (three) times daily as needed. 30 capsule 0  . predniSONE (DELTASONE) 20 MG tablet Take 1 tablet (20 mg total) by mouth daily with breakfast. (Patient not taking: Reported on 12/28/2015) 10 tablet 0  . triamcinolone cream (KENALOG) 0.1 % Apply 1 application topically 2 (two) times daily. (Patient not taking: Reported on 12/28/2015) 30 g 0   No current facility-administered medications on file prior to visit.     ROS ROS otherwise unremarkable unless listed above.   Physical Examination: BP 120/72 (BP Location: Right Arm, Patient Position: Sitting, Cuff Size: Small)   Pulse 76   Temp 98.1 F (  36.7 C) (Oral)   Resp 18   Ht 5' 9.5" (1.765 m)   Wt 164 lb 9.6 oz (74.7 kg)   SpO2 100%   BMI 23.96 kg/m  Ideal Body Weight: Weight in (lb) to have BMI = 25: 171.4  Physical Exam  Constitutional: He is oriented to person, place, and time. He appears well-developed and well-nourished. No distress.  HENT:  Head: Normocephalic and atraumatic.  Right Ear: Tympanic membrane and external ear normal.  Left Ear: Tympanic membrane, external ear and ear canal normal.  Nose: Mucosal edema (very prominent at the right nostril) and rhinorrhea present. Right sinus exhibits no maxillary sinus tenderness and no frontal sinus tenderness. Left sinus exhibits no maxillary sinus tenderness and no frontal sinus tenderness.  Mouth/Throat: No uvula swelling. No oropharyngeal  exudate, posterior oropharyngeal edema or posterior oropharyngeal erythema.  Right ear: At the 6 oclock position, moderate erythema just anterior to the TM.  No injection to the tm however.    Eyes: Conjunctivae, EOM and lids are normal. Pupils are equal, round, and reactive to light. Right eye exhibits normal extraocular motion. Left eye exhibits normal extraocular motion.  Neck: Trachea normal and full passive range of motion without pain. No edema and no erythema present.  Cardiovascular: Normal rate.   Pulmonary/Chest: Effort normal. No respiratory distress. He has no decreased breath sounds. He has no wheezes. He has no rhonchi.  Musculoskeletal:  Paraspinal cervical tenderness upon palpation without lymph node prominence.  Lymphadenopathy:       Head (right side): No occipital adenopathy present.       Head (left side): No occipital adenopathy present.    He has no cervical adenopathy.  No tenderness at the head.  No rash present   Neurological: He is alert and oriented to person, place, and time.  Skin: Skin is warm and dry. He is not diaphoretic.  Psychiatric: He has a normal mood and affect. His behavior is normal.   Wt Readings from Last 3 Encounters:  11/22/16 164 lb 9.6 oz (74.7 kg)  12/28/15 187 lb (84.8 kg)  10/08/15 184 lb (83.5 kg)    Assessment and Plan: Henry Weber is a 26 y.o. male who is here today for cc of head pain. -possible inflammation secondary to musculature, vs. Infectious cellulitis.  Will treat both at this time.  Given topical and oral antibiotic for possible otitis/sinusitis.  Given anti-inflammatory and discussed precautions.   -possible muscle strain. Discussed anti-inflammatory and muscular stretching.  Advised icing directly after.  He will rtc if sxs do not improve within 2 weeks.  Sooner if worsening.  Labs obtained with weight loss significant.  However with new found vegetarian coupled with exercise and down portions may have been the culprit.    Nonintractable headache, unspecified chronicity pattern, unspecified headache type - Plan: CBC with Differential/Platelet, Basic metabolic panel, Sedimentation Rate, acetic acid (VOSOL) 2 % otic solution, sulfamethoxazole-trimethoprim (BACTRIM DS,SEPTRA DS) 800-160 MG tablet, meloxicam (MOBIC) 15 MG tablet  Loss of weight - Plan: CBC with Differential/Platelet, Basic metabolic panel, Sedimentation Rate, acetic acid (VOSOL) 2 % otic solution, sulfamethoxazole-trimethoprim (BACTRIM DS,SEPTRA DS) 800-160 MG tablet, meloxicam (MOBIC) 15 MG tablet  Trena Platt, PA-C [facility] 3/6/20182:24 PM

## 2016-11-23 LAB — BASIC METABOLIC PANEL
BUN / CREAT RATIO: 12 (ref 9–20)
BUN: 9 mg/dL (ref 6–20)
CO2: 24 mmol/L (ref 18–29)
Calcium: 9.5 mg/dL (ref 8.7–10.2)
Chloride: 101 mmol/L (ref 96–106)
Creatinine, Ser: 0.76 mg/dL (ref 0.76–1.27)
GFR calc non Af Amer: 126 mL/min/{1.73_m2} (ref >59)
GFR, EST AFRICAN AMERICAN: 146 mL/min/{1.73_m2} (ref >59)
Glucose: 88 mg/dL (ref 65–99)
POTASSIUM: 4.8 mmol/L (ref 3.5–5.2)
SODIUM: 140 mmol/L (ref 134–144)

## 2016-11-23 LAB — CBC WITH DIFFERENTIAL/PLATELET
Basophils Absolute: 0 10*3/uL (ref 0.0–0.2)
Basos: 0 %
EOS (ABSOLUTE): 0.4 10*3/uL (ref 0.0–0.4)
EOS: 7 %
HEMATOCRIT: 42.6 % (ref 37.5–51.0)
Hemoglobin: 14.6 g/dL (ref 13.0–17.7)
Immature Grans (Abs): 0 10*3/uL (ref 0.0–0.1)
Immature Granulocytes: 0 %
LYMPHS ABS: 1.5 10*3/uL (ref 0.7–3.1)
Lymphs: 28 %
MCH: 30.8 pg (ref 26.6–33.0)
MCHC: 34.3 g/dL (ref 31.5–35.7)
MCV: 90 fL (ref 79–97)
MONOCYTES: 6 %
MONOS ABS: 0.4 10*3/uL (ref 0.1–0.9)
NEUTROS ABS: 3.2 10*3/uL (ref 1.4–7.0)
Neutrophils: 59 %
Platelets: 246 10*3/uL (ref 150–379)
RBC: 4.74 x10E6/uL (ref 4.14–5.80)
RDW: 13.3 % (ref 12.3–15.4)
WBC: 5.5 10*3/uL (ref 3.4–10.8)

## 2016-11-23 LAB — SEDIMENTATION RATE: Sed Rate: 2 mm/hr (ref 0–15)

## 2018-01-18 ENCOUNTER — Encounter (HOSPITAL_COMMUNITY): Payer: Self-pay | Admitting: Emergency Medicine

## 2018-01-18 ENCOUNTER — Emergency Department (HOSPITAL_COMMUNITY)
Admission: EM | Admit: 2018-01-18 | Discharge: 2018-01-19 | Disposition: A | Discharge status: Home / Self Care | Payer: BLUE CROSS/BLUE SHIELD | Attending: Emergency Medicine | Admitting: Emergency Medicine

## 2018-01-18 ENCOUNTER — Emergency Department (HOSPITAL_COMMUNITY): Payer: BLUE CROSS/BLUE SHIELD

## 2018-01-18 ENCOUNTER — Other Ambulatory Visit: Payer: Self-pay

## 2018-01-18 DIAGNOSIS — F17210 Nicotine dependence, cigarettes, uncomplicated: Secondary | ICD-10-CM | POA: Diagnosis not present

## 2018-01-18 DIAGNOSIS — R1013 Epigastric pain: Secondary | ICD-10-CM | POA: Diagnosis not present

## 2018-01-18 DIAGNOSIS — R079 Chest pain, unspecified: Secondary | ICD-10-CM | POA: Diagnosis not present

## 2018-01-18 DIAGNOSIS — Z79899 Other long term (current) drug therapy: Secondary | ICD-10-CM | POA: Insufficient documentation

## 2018-01-18 DIAGNOSIS — J45909 Unspecified asthma, uncomplicated: Secondary | ICD-10-CM | POA: Diagnosis not present

## 2018-01-18 DIAGNOSIS — K219 Gastro-esophageal reflux disease without esophagitis: Secondary | ICD-10-CM | POA: Diagnosis not present

## 2018-01-18 DIAGNOSIS — R110 Nausea: Secondary | ICD-10-CM | POA: Diagnosis not present

## 2018-01-18 LAB — BASIC METABOLIC PANEL
ANION GAP: 10 (ref 5–15)
BUN: 11 mg/dL (ref 6–20)
CHLORIDE: 106 mmol/L (ref 101–111)
CO2: 24 mmol/L (ref 22–32)
Calcium: 9.3 mg/dL (ref 8.9–10.3)
Creatinine, Ser: 0.69 mg/dL (ref 0.61–1.24)
GFR calc Af Amer: >60 mL/min (ref >60)
GLUCOSE: 85 mg/dL (ref 65–99)
POTASSIUM: 3.8 mmol/L (ref 3.5–5.1)
Sodium: 140 mmol/L (ref 135–145)

## 2018-01-18 LAB — CBC
HEMATOCRIT: 43.1 % (ref 39.0–52.0)
HEMOGLOBIN: 14.8 g/dL (ref 13.0–17.0)
MCH: 31.4 pg (ref 26.0–34.0)
MCHC: 34.3 g/dL (ref 30.0–36.0)
MCV: 91.3 fL (ref 78.0–100.0)
Platelets: 238 10*3/uL (ref 150–400)
RBC: 4.72 MIL/uL (ref 4.22–5.81)
RDW: 12.3 % (ref 11.5–15.5)
WBC: 6.6 10*3/uL (ref 4.0–10.5)

## 2018-01-18 LAB — I-STAT TROPONIN, ED: Troponin i, poc: 0 ng/mL (ref 0.00–0.08)

## 2018-01-18 NOTE — ED Triage Notes (Signed)
Pt reports epigastric pain X several years.... Pain has worsened over the past few days. Burning sensation, 2/10. Pt states hx of GERD, he took Tums and drank water and pain got better.

## 2018-01-19 MED ORDER — OMEPRAZOLE 20 MG PO CPDR: 20.0000 mg | DELAYED_RELEASE_CAPSULE | Freq: Every day | ORAL | 0 refills | Status: DC

## 2018-01-19 NOTE — ED Provider Notes (Signed)
[facility] EMERGENCY DEPARTMENT Provider Note   CSN: [id number] Arrival date & time: 01/18/18  2148     History   Chief Complaint Chief Complaint  Patient presents with  . Chest Pain    HPI Henry Weber is a 27 y.o. male.  The history is provided by the patient and a significant other.  Abdominal Pain   This is a new problem. The current episode started more than 2 days ago. The problem occurs daily. The problem has been gradually improving. The pain is located in the epigastric region. The pain is moderate. Associated symptoms include nausea. Pertinent negatives include fever, diarrhea, hematochezia, melena and vomiting. The symptoms are aggravated by palpation. Relieved by: Tums and water.   Patient with previous history of ADHD and asthma presents with epigastric abdominal pain and burning.  He reports it similar to previous episodes of reflux.  He reports he changed his diet last year and hasn't had any issues until the past couple days.  He has previously been on Prilosec with improvement.  No fever or vomiting.  He does not use excessive NSAIDs.  He drinks alcohol minimally. Past Medical History:  Diagnosis Date  . ADHD (attention deficit hyperactivity disorder)   . Allergy   . Asthma     There are no active problems to display for this patient.   History reviewed. No pertinent surgical history.      Home Medications    Prior to Admission medications   Medication Sig Start Date End Date Taking? Authorizing Provider  albuterol (PROVENTIL HFA;VENTOLIN HFA) 108 (90 BASE) MCG/ACT inhaler Inhale 2 puffs into the lungs every 4 (four) hours as needed for wheezing or shortness of breath (cough, shortness of breath or wheezing.). 02/27/14  Yes Carmelina Dane, MD  omeprazole (PRILOSEC) 20 MG capsule Take 1 capsule (20 mg total) by mouth daily. 01/19/18   Zadie Rhine, MD  triamcinolone cream (KENALOG) 0.1 % Apply 1 application topically 2 (two) times  daily. Patient not taking: Reported on 01/19/2018 10/08/15   Shade Flood, MD    Family History Family History  Problem Relation Age of Onset  . Cancer Mother     Social History Social History   Tobacco Use  . Smoking status: Current Every Day Smoker    Packs/day: 0.20    Years: 7.00    Pack years: 1.40    Types: Cigarettes  . Smokeless tobacco: Never Used  Substance Use Topics  . Alcohol use: No    Alcohol/week: 0.0 oz  . Drug use: Yes    Types: Marijuana     Allergies   Patient has no known allergies.   Review of Systems Review of Systems  Constitutional: Negative for fever.  Respiratory: Negative for shortness of breath.   Gastrointestinal: Positive for abdominal pain and nausea. Negative for blood in stool, diarrhea, hematochezia, melena and vomiting.  All other systems reviewed and are negative.    Physical Exam Updated Vital Signs BP 119/83   Pulse 72   Temp 98 F (36.7 C) (Oral)   Resp 12   Ht 1.727 m ( )   Wt 72.6 kg (160 lb)   SpO2 99%   BMI 24.33 kg/m   Physical Exam CONSTITUTIONAL: Well developed/well nourished, smiling and watching TV HEAD: Normocephalic/atraumatic EYES: EOMI/PERRL, no icterus, conjunctival pink ENMT: Mucous membranes moist NECK: supple no meningeal signs SPINE/BACK:entire spine nontender CV: S1/S2 noted, no murmurs/rubs/gallops noted LUNGS: Lungs are clear to auscultation bilaterally, no apparent  distress ABDOMEN: soft, mild epigastric tenderness, no right upper quadrant tenderness, no rebound or guarding, bowel sounds noted throughout abdomen GU:no cva tenderness NEURO: Pt is awake/alert/appropriate, moves all extremitiesx4.  EXTREMITIES: pulses normal/equal, full ROM SKIN: warm, color normal PSYCH: no abnormalities of mood noted, alert and oriented to situation   ED Treatments / Results  Labs (all labs ordered are listed, but only abnormal results are displayed) Labs Reviewed  BASIC METABOLIC PANEL  CBC    I-STAT TROPONIN, ED    EKG EKG Interpretation  Date/Time:  Thursday Jan 18 2018 21:52:24 EDT Ventricular Rate:  63 PR Interval:  126 QRS Duration: 86 QT Interval:  388 QTC Calculation: 397 R Axis:   86 Text Interpretation:  Sinus rhythm with Premature atrial complexes Early repolarization Otherwise normal ECG No previous ECGs available Confirmed by Zadie Rhine ([id number]) on 01/19/2018 4:29:35 AM   Radiology Dg Chest 2 View  Result Date: 01/18/2018 CLINICAL DATA:  Chest pain EXAM: CHEST - 2 VIEW COMPARISON:  None. FINDINGS: Lungs are clear.  No pleural effusion or pneumothorax. The heart is normal in size. Visualized osseous structures are within normal limits. IMPRESSION: Normal chest radiographs. Electronically Signed   By: Charline Bills M.D.   On: 01/18/2018 22:11    Procedures Procedures (including critical care time)  Medications Ordered in ED Medications - No data to display   Initial Impression / Assessment and Plan / ED Course  I have reviewed the triage vital signs and the nursing notes.  Pertinent labs & imaging results that were available during my care of the patient were reviewed by me and considered in my medical decision making (see chart for details).     Patient here mostly for epigastric pain.  He denied any chest pain on my evaluation For similar to prior episodes that responded to Prilosec.  We will restart this, and refer him to GI. Otherwise well-appearing and appropriate for discharge.  Final Clinical Impressions(s) / ED Diagnoses   Final diagnoses:  Gastroesophageal reflux disease without esophagitis    ED Discharge Orders        Ordered    omeprazole (PRILOSEC) 20 MG capsule  Daily     01/19/18 0535       Zadie Rhine, MD 01/19/18 [phone]

## 2018-01-19 NOTE — ED Notes (Signed)
Pt. In room vital signs stable, family @ bedside.

## 2018-07-13 DIAGNOSIS — Z23 Encounter for immunization: Secondary | ICD-10-CM | POA: Diagnosis not present

## 2019-02-20 IMAGING — DX DG CHEST 2V
2 series · 2 of 2 positions shown · non-contrast
Comparison: None.

CLINICAL DATA: Chest pain

EXAM:
CHEST - 2 VIEW

[chest pa]
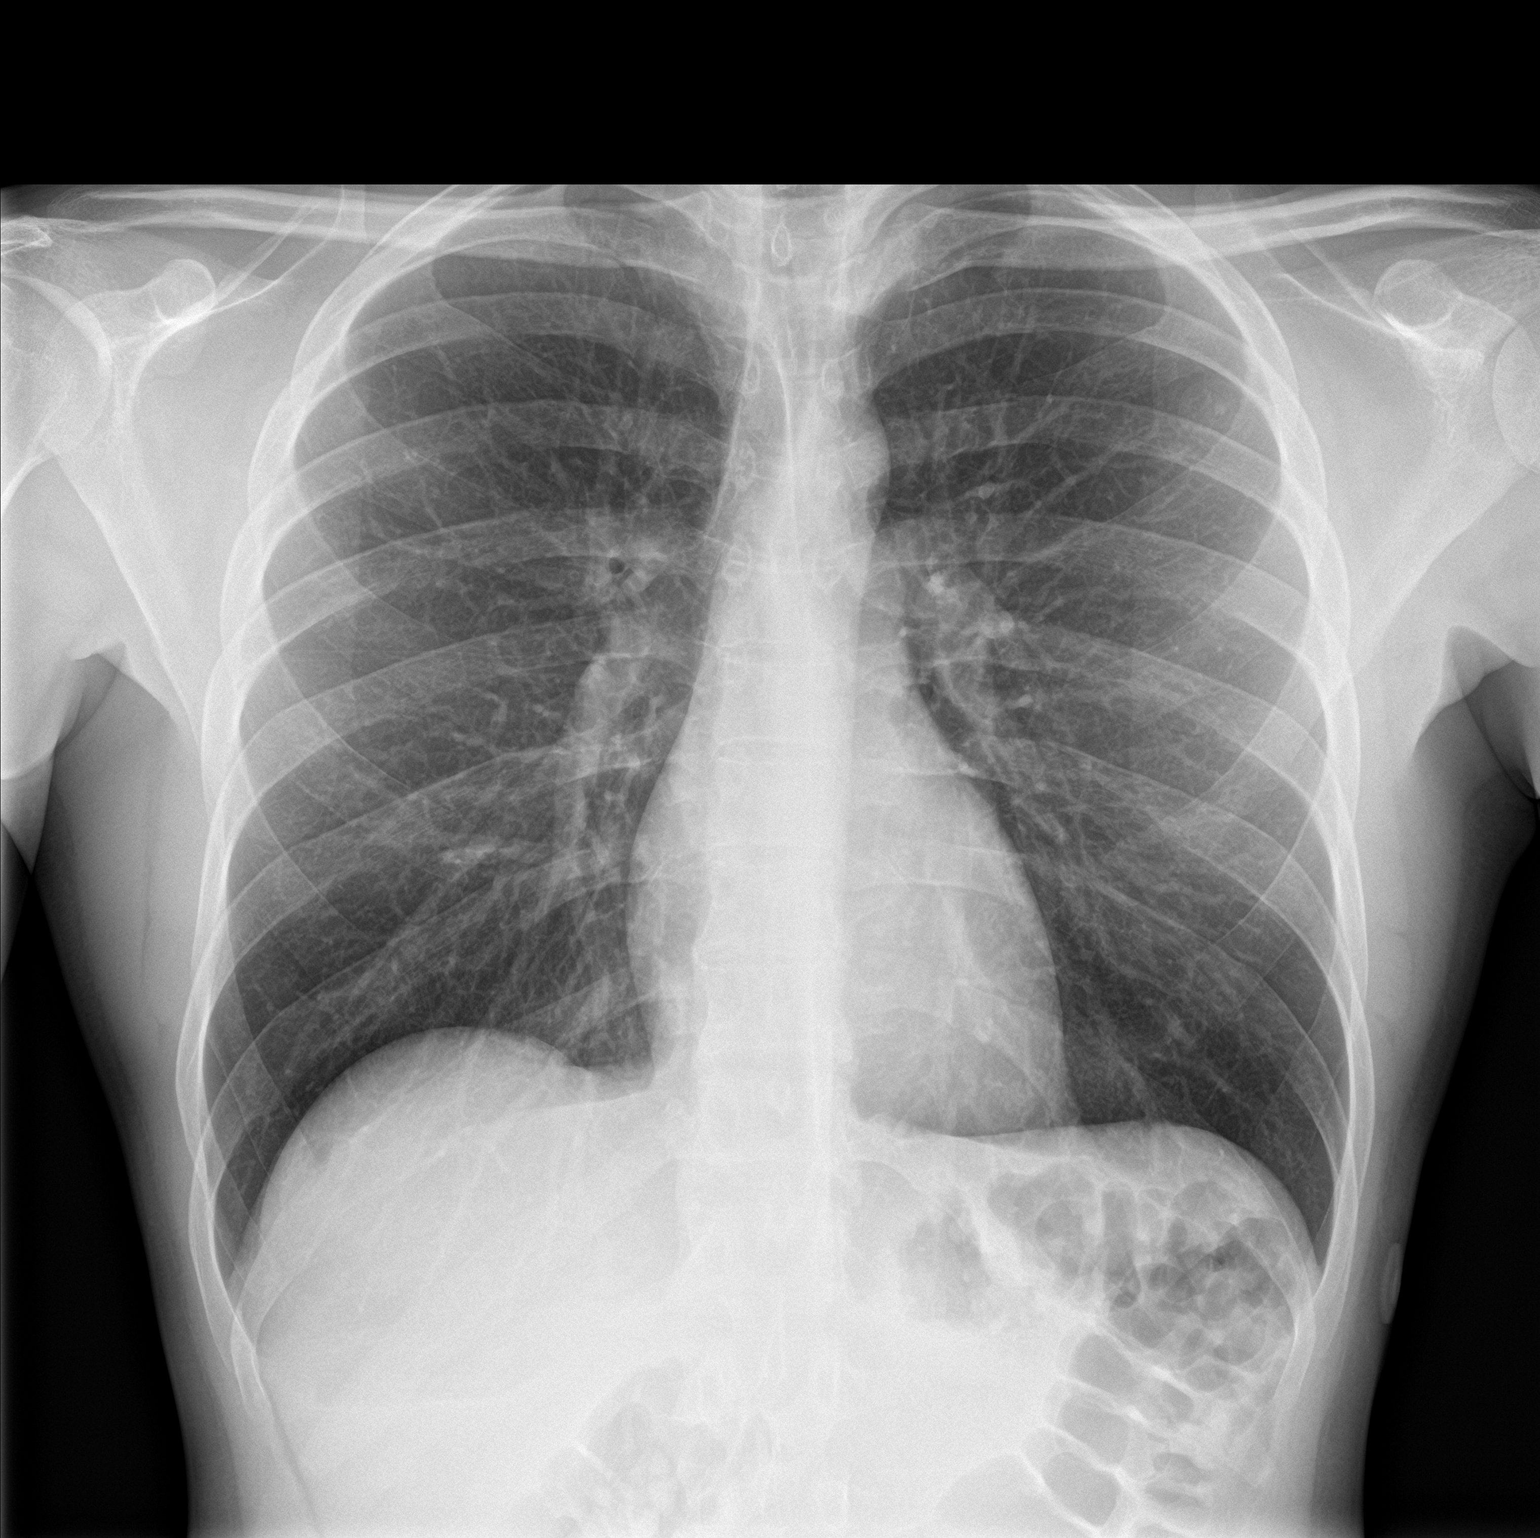

[chest lat]
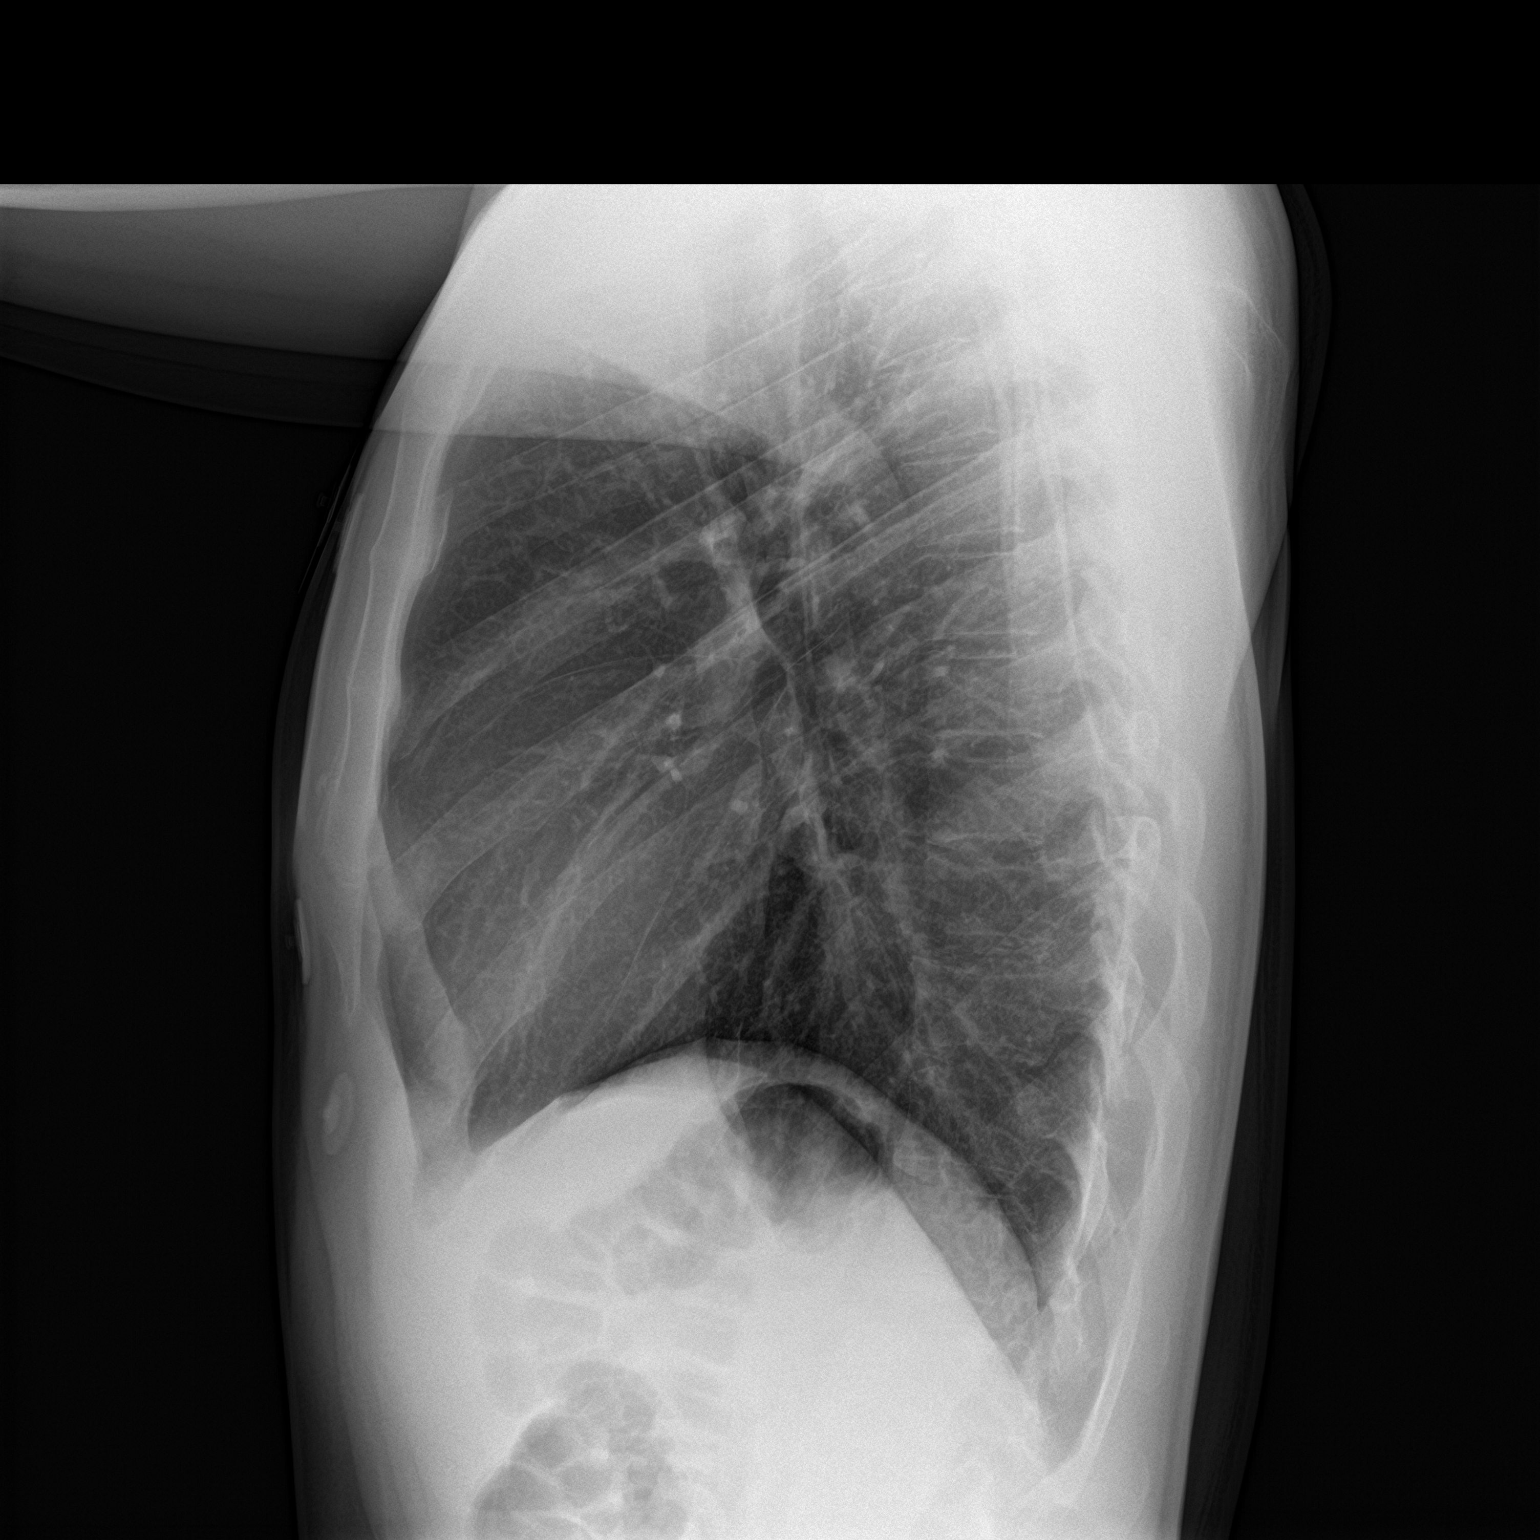

[2 of 2 positions shown; findings below may reference images not displayed]

FINDINGS: Lungs are clear.  No pleural effusion or pneumothorax.

The heart is normal in size.

Visualized osseous structures are within normal limits.
IMPRESSION: Normal chest radiographs.

## 2019-05-15 ENCOUNTER — Ambulatory Visit: Payer: BC Managed Care – PPO | Admitting: Family Medicine

## 2019-05-15 ENCOUNTER — Other Ambulatory Visit: Payer: Self-pay

## 2019-05-15 ENCOUNTER — Encounter: Payer: Self-pay | Admitting: Family Medicine

## 2019-05-15 VITALS — BP 120/78 | HR 72 | Temp 97.4°F | Ht 70.5 in | Wt 166.8 lb

## 2019-05-15 DIAGNOSIS — J302 Other seasonal allergic rhinitis: Secondary | ICD-10-CM

## 2019-05-15 DIAGNOSIS — M79642 Pain in left hand: Secondary | ICD-10-CM

## 2019-05-15 DIAGNOSIS — Z23 Encounter for immunization: Secondary | ICD-10-CM | POA: Diagnosis not present

## 2019-05-15 DIAGNOSIS — M79641 Pain in right hand: Secondary | ICD-10-CM | POA: Diagnosis not present

## 2019-05-15 DIAGNOSIS — R1013 Epigastric pain: Secondary | ICD-10-CM

## 2019-05-15 DIAGNOSIS — J4520 Mild intermittent asthma, uncomplicated: Secondary | ICD-10-CM | POA: Diagnosis not present

## 2019-05-15 DIAGNOSIS — K219 Gastro-esophageal reflux disease without esophagitis: Secondary | ICD-10-CM | POA: Diagnosis not present

## 2019-05-15 DIAGNOSIS — L2481 Irritant contact dermatitis due to metals: Secondary | ICD-10-CM | POA: Diagnosis not present

## 2019-05-15 DIAGNOSIS — Z8261 Family history of arthritis: Secondary | ICD-10-CM

## 2019-05-15 DIAGNOSIS — Z8379 Family history of other diseases of the digestive system: Secondary | ICD-10-CM

## 2019-05-15 MED ORDER — TRIAMCINOLONE ACETONIDE 0.1 % EX CREA: 1.0000 "application " | TOPICAL_CREAM | Freq: Two times a day (BID) | CUTANEOUS | 0 refills | Status: AC

## 2019-05-15 NOTE — Patient Instructions (Addendum)
You may continue taking Prilosec for reflux.  I am referring you to [facility] for your symptoms.  They will call you.   You may want to try taking a daily non-drowsy antihistamine such as Claritin, Allegra, or Xyzal to help control allergies and asthma.  Continue on the daily Advair and if you are needing your albuterol inhaler more than 2 times per week, let me know.   We will call you with your lab results.          Gastroesophageal Reflux Disease, Adult Gastroesophageal reflux (GER) happens when acid from the stomach flows up into the tube that connects the mouth and the stomach (esophagus). Normally, food travels down the esophagus and stays in the stomach to be digested. With GER, food and stomach acid sometimes move back up into the esophagus. You may have a disease called gastroesophageal reflux disease (GERD) if the reflux:  Happens often.  Causes frequent or very bad symptoms.  Causes problems such as damage to the esophagus. When this happens, the esophagus becomes sore and swollen (inflamed). Over time, GERD can make small holes (ulcers) in the lining of the esophagus. What are the causes? This condition is caused by a problem with the muscle between the esophagus and the stomach. When this muscle is weak or not normal, it does not close properly to keep food and acid from coming back up from the stomach. The muscle can be weak because of:  Tobacco use.  Pregnancy.  Having a certain type of hernia (hiatal hernia).  Alcohol use.  Certain foods and drinks, such as coffee, chocolate, onions, and peppermint. What increases the risk? You are more likely to develop this condition if you:  Are overweight.  Have a disease that affects your connective tissue.  Use NSAID medicines. What are the signs or symptoms? Symptoms of this condition include:  Heartburn.  Difficult or painful swallowing.  The feeling of having a lump in the throat.  A bitter taste in the  mouth.  Bad breath.  Having a lot of saliva.  Having an upset or bloated stomach.  Belching.  Chest pain. Different conditions can cause chest pain. Make sure you see your doctor if you have chest pain.  Shortness of breath or noisy breathing (wheezing).  Ongoing (chronic) cough or a cough at night.  Wearing away of the surface of teeth (tooth enamel).  Weight loss. How is this treated? Treatment will depend on how bad your symptoms are. Your doctor may suggest:  Changes to your diet.  Medicine.  Surgery. Follow these instructions at home: Eating and drinking   Follow a diet as told by your doctor. You may need to avoid foods and drinks such as: ? Coffee and tea (with or without caffeine). ? Drinks that contain alcohol. ? Energy drinks and sports drinks. ? Bubbly (carbonated) drinks or sodas. ? Chocolate and cocoa. ? Peppermint and mint flavorings. ? Garlic and onions. ? Horseradish. ? Spicy and acidic foods. These include peppers, chili powder, curry powder, vinegar, hot sauces, and BBQ sauce. ? Citrus fruit juices and citrus fruits, such as oranges, lemons, and limes. ? Tomato-based foods. These include red sauce, chili, salsa, and pizza with red sauce. ? Fried and fatty foods. These include donuts, french fries, potato chips, and high-fat dressings. ? High-fat meats. These include hot dogs, rib eye steak, sausage, ham, and bacon. ? High-fat dairy items, such as whole milk, butter, and cream cheese.  Eat small meals often. Avoid eating  large meals.  Avoid drinking large amounts of liquid with your meals.  Avoid eating meals during the 2-3 hours before bedtime.  Avoid lying down right after you eat.  Do not exercise right after you eat. Lifestyle   Do not use any products that contain nicotine or tobacco. These include cigarettes, e-cigarettes, and chewing tobacco. If you need help quitting, ask your doctor.  Try to lower your stress. If you need help  doing this, ask your doctor.  If you are overweight, lose an amount of weight that is healthy for you. Ask your doctor about a safe weight loss goal. General instructions  Pay attention to any changes in your symptoms.  Take over-the-counter and prescription medicines only as told by your doctor. Do not take aspirin, ibuprofen, or other NSAIDs unless your doctor says it is okay.  Wear loose clothes. Do not wear anything tight around your waist.  Raise (elevate) the head of your bed about 6 inches (15 cm).  Avoid bending over if this makes your symptoms worse.  Keep all follow-up visits as told by your doctor. This is important. Contact a doctor if:  You have new symptoms.  You lose weight and you do not know why.  You have trouble swallowing or it hurts to swallow.  You have wheezing or a cough that keeps happening.  Your symptoms do not get better with treatment.  You have a hoarse voice. Get help right away if:  You have pain in your arms, neck, jaw, teeth, or back.  You feel sweaty, dizzy, or light-headed.  You have chest pain or shortness of breath.  You throw up (vomit) and your throw-up looks like blood or coffee grounds.  You pass out (faint).  Your poop (stool) is bloody or black.  You cannot swallow, drink, or eat. Summary  If a person has gastroesophageal reflux disease (GERD), food and stomach acid move back up into the esophagus and cause symptoms or problems such as damage to the esophagus.  Treatment will depend on how bad your symptoms are.  Follow a diet as told by your doctor.  Take all medicines only as told by your doctor. This information is not intended to replace advice given to you by your health care provider. Make sure you discuss any questions you have with your health care provider. Document Released: 02/22/2008 Document Revised: 03/14/2018 Document Reviewed: 03/14/2018 Elsevier Patient Education  2020 [employer].

## 2019-05-15 NOTE — Progress Notes (Signed)
Subjective:    Patient ID: Henry Weber, male    DOB: 10-Jan-1991, 28 y.o.   MRN: [id number]  HPI Chief Complaint  Patient presents with  . new pt    new pt get established. gerd- really bad, tried otc not much relief, lump in strerum   He is new to the practice and here to establish care. He has several concerns today.  Previous medical care: no PCP recently   Other providers: None   GERD- diagnosed in 2016.   Recalls certain foods upsetting his stomach then. He has taken antacids sporadically. Has been taking Prilosec for at least the past 2 weeks for heartburn, early satiety and epigastric pain he describes as "burning" after eating. States he does not look forward to eating because of pain.  No significant weight loss.   Denies fever, chills, dizziness, chest pain, palpitations, shortness of breath, N/V/D, urinary symptoms.   States he has decreased acid in diet. No NSAIDs or alcohol.   Vegan diet mainly.  Drinking Kombucha.   ED visit in May 2019 for GERD and was [facility]. States he did not go and has not seen GI before.   States his father told him he has a history of Barrett's esophagus. Patient concerned he may have this.   Allergies- seasonal. Not currently on medication.   Asthma- since childhood. Advair daily for the past week.  Albuterol use is infrequent. Thinks reflux may be making his asthma worse.   Smoker- cigarettes and marijuana    Complains of bilateral hand pain, left worse than right since 2018. Pain with playing guitar. States he has played guitar since he was young. Denies pain at rest. No pain or stiffness upon waking up. No swelling or increased warmth.   Father with osteoarthritis.  PGM with RA.   He also complains of a pruritic rash along the belt line with history of similar rash. States he was told he has a nickel allergy but he bought a new belt that supposedly does not have nickel in it. Rash comes and goes. He has used a steroid  cream in the past and is out of it. Requests refill.    Social history: has a male significant other, works for [employer]    Reviewed allergies, medications, past medical, surgical, family, and social history.   Review of Systems Pertinent positives and negatives in the history of present illness.     Objective:   Physical Exam Constitutional:      General: He is not in acute distress.    Appearance: Normal appearance. He is not ill-appearing.  Eyes:     General: Lids are normal. Vision grossly intact.     Conjunctiva/sclera: Conjunctivae normal.  Neck:     Musculoskeletal: Full passive range of motion without pain and neck supple.     Thyroid: No thyromegaly.  Cardiovascular:     Rate and Rhythm: Normal rate and regular rhythm.     Heart sounds: Normal heart sounds.  Pulmonary:     Effort: Pulmonary effort is normal.     Breath sounds: Normal breath sounds.  Abdominal:     General: Abdomen is flat. Bowel sounds are normal. There is no distension.     Palpations: Abdomen is soft. There is no hepatomegaly or mass.     Tenderness: There is no abdominal tenderness. There is no right CVA tenderness, left CVA tenderness, guarding or rebound. Negative signs include Murphy's sign, McBurney's sign and psoas sign.  Musculoskeletal:     Right lower leg: No edema.     Left lower leg: No edema.  Lymphadenopathy:     Cervical: No cervical adenopathy.     Upper Body:     Right upper body: No supraclavicular adenopathy.     Left upper body: No supraclavicular adenopathy.  Skin:    General: Skin is warm and dry.     Findings: Rash present.          Comments: Dry, scaling and pruritic rash along his belt line, rash is more pronounced closer to the buckle area. No sign of infection   Neurological:     Mental Status: He is alert and oriented to person, place, and time.     Cranial Nerves: Cranial nerves are intact.     Sensory: Sensation is intact.     Motor: Motor function is  intact.     Gait: Gait is intact.  Psychiatric:        Attention and Perception: Attention and perception normal.        Mood and Affect: Mood normal.        Speech: Speech normal.        Behavior: Behavior normal.        Cognition and Memory: Cognition and memory normal.    BP 120/78   Pulse 72   Temp (!) 97.4 F (36.3 C)   Ht 5' 10.5" (1.791 m)   Wt 166 lb 12.8 oz (75.7 kg)   BMI 23.60 kg/m       Assessment & Plan:  Gastroesophageal reflux disease, esophagitis presence not specified - Plan: CBC with Differential/Platelet, Comprehensive metabolic panel, Ambulatory referral to Gastroenterology. Counseling on lifestyle modification to help control GERD. He has made several modifications. Has been dealing with GERD for years and has not seen GI. Continue Prilosec and refer to GI.   Pain in both hands - Plan: Rheumatoid factor- no sign of inflammation. suspect arthritis due to years of playing guitar.   Mild intermittent asthma without complication- avoiding triggers and using advair on regular basis. Seems controlled for now.   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 Discussed wearing a shirt tucked in or going without a belt to avoid further irritation. Triamcinolone prescribed.   Family history of Barrett's esophagus - Plan: Ambulatory referral to Gastroenterology  Seasonal allergies- may start on non-drowsy antihistamine.   Family history of rheumatoid arthritis - Plan: Rheumatoid factor- rule out RA. 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   Postprandial epigastric pain - Plan: CBC with Differential/Platelet, Comprehensive metabolic panel, Ambulatory referral to Gastroenterology. No sign of gallbladder but did consider  Need for influenza vaccination - Plan: Flu Vaccine QUAD 36+ mos IM. Vaccine given. Possible side effects discussed.   Recommend he return for a fasting CPE soon.   Spent at least 45 minutes face to face with  patient and more than 50% was in counseling and coordination of care.

## 2019-05-16 LAB — CBC WITH DIFFERENTIAL/PLATELET
Basophils Absolute: 0 10*3/uL (ref 0.0–0.2)
Basos: 1 %
EOS (ABSOLUTE): 0.2 10*3/uL (ref 0.0–0.4)
Eos: 5 %
Hematocrit: 40.8 % (ref 37.5–51.0)
Hemoglobin: 14.1 g/dL (ref 13.0–17.7)
Immature Grans (Abs): 0 10*3/uL (ref 0.0–0.1)
Immature Granulocytes: 0 %
Lymphocytes Absolute: 1.5 10*3/uL (ref 0.7–3.1)
Lymphs: 36 %
MCH: 30.6 pg (ref 26.6–33.0)
MCHC: 34.6 g/dL (ref 31.5–35.7)
MCV: 89 fL (ref 79–97)
Monocytes Absolute: 0.5 10*3/uL (ref 0.1–0.9)
Monocytes: 12 %
Neutrophils Absolute: 2 10*3/uL (ref 1.4–7.0)
Neutrophils: 46 %
Platelets: 220 10*3/uL (ref 150–450)
RBC: 4.61 x10E6/uL (ref 4.14–5.80)
RDW: 12.1 % (ref 11.6–15.4)
WBC: 4.2 10*3/uL (ref 3.4–10.8)

## 2019-05-16 LAB — COMPREHENSIVE METABOLIC PANEL
ALT: 21 IU/L (ref 0–44)
AST: 18 IU/L (ref 0–40)
Albumin/Globulin Ratio: 2.2 (ref 1.2–2.2)
Albumin: 4.7 g/dL (ref 4.1–5.2)
Alkaline Phosphatase: 80 IU/L (ref 39–117)
BUN/Creatinine Ratio: 14 (ref 9–20)
BUN: 11 mg/dL (ref 6–20)
Bilirubin Total: 0.3 mg/dL (ref 0.0–1.2)
CO2: 20 mmol/L (ref 20–29)
Calcium: 9.2 mg/dL (ref 8.7–10.2)
Chloride: 103 mmol/L (ref 96–106)
Creatinine, Ser: 0.77 mg/dL (ref 0.76–1.27)
GFR calc Af Amer: 143 mL/min/{1.73_m2} (ref >59)
GFR calc non Af Amer: 123 mL/min/{1.73_m2} (ref >59)
Globulin, Total: 2.1 g/dL (ref 1.5–4.5)
Glucose: 74 mg/dL (ref 65–99)
Potassium: 4.5 mmol/L (ref 3.5–5.2)
Sodium: 139 mmol/L (ref 134–144)
Total Protein: 6.8 g/dL (ref 6.0–8.5)

## 2019-05-16 LAB — RHEUMATOID FACTOR: Rhuematoid fact SerPl-aCnc: <10 IU/mL (ref 0.0–13.9)

## 2019-05-22 ENCOUNTER — Encounter: Payer: Self-pay | Admitting: Gastroenterology

## 2019-06-27 ENCOUNTER — Ambulatory Visit (INDEPENDENT_AMBULATORY_CARE_PROVIDER_SITE_OTHER): Payer: BC Managed Care – PPO | Admitting: Gastroenterology

## 2019-06-27 ENCOUNTER — Encounter: Payer: Self-pay | Admitting: Gastroenterology

## 2019-06-27 ENCOUNTER — Other Ambulatory Visit: Payer: Self-pay

## 2019-06-27 VITALS — BP 122/72 | HR 82 | Temp 97.9°F | Ht 70.0 in | Wt 166.4 lb

## 2019-06-27 DIAGNOSIS — R1013 Epigastric pain: Secondary | ICD-10-CM | POA: Diagnosis not present

## 2019-06-27 DIAGNOSIS — G8929 Other chronic pain: Secondary | ICD-10-CM

## 2019-06-27 DIAGNOSIS — R079 Chest pain, unspecified: Secondary | ICD-10-CM | POA: Diagnosis not present

## 2019-06-27 MED ORDER — OMEPRAZOLE 40 MG PO CPDR: 40.0000 mg | DELAYED_RELEASE_CAPSULE | Freq: Every day | ORAL | 2 refills | Status: DC

## 2019-06-27 NOTE — Patient Instructions (Addendum)
Let's increase your omeprazole to 40 mg every morning.   I recommend the following lifestyle modifications:  Take your omeprazole 30 minutes before meals Avoid any dietary triggers Avoid spicy and acidic foods Limit your intake of coffee, tea, alcohol, and carbonated drinks Work to maintain a healthy weight Keep the head of the bed elevated with blocks if you are having any nighttime symptoms Stay upright for 2 hours after eating Avoid meals and snacks three to four hours before bedtime  Not smoking cigarettes with help your gut. Marijuana is also known to exacerbate and cause GI symptoms.   Let's check in over the next couple of months. We will plan an upper endoscopy with biopsies if your symptoms are not better.   Please call with any questions or concerns in the meantime.

## 2019-06-27 NOTE — Progress Notes (Signed)
Referring Provider: Avanell Shackleton, NP-C Primary Care Physician:  Avanell Shackleton, NP-C  Reason for Consultation:  GERD   IMPRESSION:  GERD Epigastric pain Atypical chest pain  Although his symptoms may be due to incompletely treated reflux, given his history of asthma and allergies eosinophilic esophagitis must be considered. We discussed evaluation and treatment options.  He elected to proceed with a trial of daily proton pump inhibitor therapy for 8 weeks.  We will proceed with upper endoscopy with biopsies if his symptoms persist beyond that time.  He will contact me in the meantime if any new questions or concerns develop.  PLAN: Omeprazole 40 mg QAM x 8 weeks Lifestyle modifications -brochure provided Follow-up in 8 weeks EGD with biopsies if symptoms persist  Please see the "Patient Instructions" section for addition details about the plan.  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 The history is obtained through the patient and review of his electronic health record. He has seasonal allergies not requiring medications and asthma treated with Advair and infrequently Albuterol. He smokes cigarettes and marijuana.   GERD- diagnosed in 2016.  He has taken antacids sporadically since that time.   Presents now with left sided chest pain and sternal pain. Feels like something is sticking out at the xiphoid when the pain occurs. Pain occurs in the morning, at night, and around eating.  Dreads eating due to the symptoms. Associated nausea, early satiety, and "burning" after eating.  Adequte appetite. No weight loss. No evidence for GI bleeding, iron deficiency anemia, anorexia, unexplained weight loss, dysphagia, odynophagia, persistent vomiting, or gastrointestinal cancer in a first-degree relative.  He decreased acid in diet. No NSAIDs or alcohol.   Vegan diet x 4 years. Drinking Kombucha which provides some relief.  Taking omeprazole with  some improvement. Was using OTC for 2 weeks. Symptoms improved from 9 to 5-6 out of 10. Did not know to take the OTC omeprazole for more than 14 days.  No other associated symptoms. No identified exacerbating or relieving features.    Asthma is worsened with the GI flares. He would like to quit smoking to help improve his asthma.   Has developed an "itchy" throat with bananas, pears, and tree nuts. He remembers similar symptoms as a child.   ED visit in May 2019 for GERD and was referred to [facility]. States he did not go and has not seen GI before.   His father has Barrett's esophagus. Patient concerned he may have this.   No known family history of colon cancer or polyps. No family history of uterine/endometrial cancer, pancreatic cancer or gastric/stomach cancer.   Past Medical History:  Diagnosis Date  . ADHD (attention deficit hyperactivity disorder)   . Allergy   . Asthma     History reviewed. No pertinent surgical history.  Current Outpatient Medications  Medication Sig Dispense Refill  . albuterol (PROVENTIL HFA;VENTOLIN HFA) 108 (90 BASE) MCG/ACT inhaler Inhale 2 puffs into the lungs every 4 (four) hours as needed for wheezing or shortness of breath (cough, shortness of breath or wheezing.). 1 Inhaler 12  . Fluticasone-Salmeterol (ADVAIR HFA IN) Inhale into the lungs.    Marland Kitchen omeprazole (PRILOSEC) 20 MG capsule Take 1 capsule (20 mg total) by mouth daily. 30 capsule 0  . triamcinolone cream (KENALOG) 0.1 % Apply 1 application topically 2 (two) times daily. 30 g 0   No current facility-administered medications for this visit.  Allergies as of 06/27/2019 - Review Complete 06/27/2019  Allergen Reaction Noted  . Other  05/15/2019    Family History  Problem Relation Age of Onset  . Cancer Mother   . Barrett's esophagus Father   . Colon cancer Neg Hx   . Esophageal cancer Neg Hx     Social History   Socioeconomic History  . Marital status: Significant Other     Spouse name: Not on file  . Number of children: Not on file  . Years of education: Not on file  . Highest education level: Not on file  Occupational History  . Not on file  Social Needs  . Financial resource strain: Not on file  . Food insecurity    Worry: Not on file    Inability: Not on file  . Transportation needs    Medical: Not on file    Non-medical: Not on file  Tobacco Use  . Smoking status: Current Every Day Smoker    Packs/day: 0.20    Years: 7.00    Pack years: 1.40    Types: Cigarettes  . Smokeless tobacco: Never Used  Substance and Sexual Activity  . Alcohol use: No    Alcohol/week: 0.0 standard drinks  . Drug use: Yes    Types: Marijuana  . Sexual activity: Not on file  Lifestyle  . Physical activity    Days per week: Not on file    Minutes per session: Not on file  . Stress: Not on file  Relationships  . Social Herbalist on phone: Not on file    Gets together: Not on file    Attends religious service: Not on file    Active member of club or organization: Not on file    Attends meetings of clubs or organizations: Not on file    Relationship status: Not on file  . Intimate partner violence    Fear of current or ex partner: Not on file    Emotionally abused: Not on file    Physically abused: Not on file    Forced sexual activity: Not on file  Other Topics Concern  . Not on file  Social History Narrative  . Not on file    Review of Systems: 12 system ROS is negative except as noted above with the additions of anxiety, arthritis, back pain, headaches, and shortness of breath.   Physical Exam: General:   Alert,  well-nourished, pleasant and cooperative in NAD Head:  Normocephalic and atraumatic. Eyes:  Sclera clear, no icterus.   Conjunctiva pink. Ears:  Normal auditory acuity. Nose:  No deformity, discharge,  or lesions. Mouth:  No deformity or lesions.   Neck:  Supple; no masses or thyromegaly. Lungs:  Clear throughout to  auscultation.   No wheezes. Heart:  Regular rate and rhythm; no murmurs. Abdomen:  Soft,nontender, nondistended, normal bowel sounds, no rebound or guarding. No hepatosplenomegaly.   Rectal:  Deferred  Msk:  Symmetrical. No boney deformities LAD: No inguinal or umbilical LAD Extremities:  No clubbing or edema. Neurologic:  Alert and  oriented x4;  grossly nonfocal Skin:  Intact without significant lesions or rashes. Psych:  Alert and cooperative. Normal mood and affect.     Henry Morici L. Tarri Glenn, MD, MPH 06/27/2019, 9:29 AM

## 2019-07-15 ENCOUNTER — Encounter: Payer: BC Managed Care – PPO | Admitting: Family Medicine

## 2019-09-10 ENCOUNTER — Ambulatory Visit: Payer: BC Managed Care – PPO | Attending: Internal Medicine

## 2019-09-10 DIAGNOSIS — Z20822 Contact with and (suspected) exposure to covid-19: Secondary | ICD-10-CM

## 2019-09-10 DIAGNOSIS — Z20828 Contact with and (suspected) exposure to other viral communicable diseases: Secondary | ICD-10-CM | POA: Diagnosis not present

## 2019-09-11 LAB — NOVEL CORONAVIRUS, NAA: SARS-CoV-2, NAA: NOT DETECTED

## 2019-10-02 ENCOUNTER — Other Ambulatory Visit: Payer: Self-pay | Admitting: Gastroenterology

## 2019-12-24 ENCOUNTER — Ambulatory Visit: Payer: BC Managed Care – PPO | Admitting: Gastroenterology

## 2020-01-03 ENCOUNTER — Other Ambulatory Visit: Payer: Self-pay | Admitting: Gastroenterology

## 2020-01-07 ENCOUNTER — Ambulatory Visit: Payer: BC Managed Care – PPO | Admitting: Gastroenterology

## 2020-02-03 DIAGNOSIS — Z720 Tobacco use: Secondary | ICD-10-CM | POA: Diagnosis not present

## 2020-02-10 ENCOUNTER — Ambulatory Visit: Payer: BC Managed Care – PPO | Admitting: Gastroenterology

## 2020-04-09 ENCOUNTER — Other Ambulatory Visit: Payer: Self-pay | Admitting: Gastroenterology

## 2020-04-15 ENCOUNTER — Telehealth: Payer: Self-pay

## 2020-04-15 NOTE — Telephone Encounter (Signed)
Thanks for the information. Do they cover any prescription PPI or do they require use of OTC even if the dose is not OTC dose? Thank you.

## 2020-04-15 NOTE — Telephone Encounter (Signed)
Dr Orvan Falconer,  Pt's insurance has denied his Omeprazole 40ng- QD : Explanation of Basis for Determination: The health benefit plan does not cover the following services, supplies, drugs or charges: Any drug that is therapeutically equivalent to an over-the-counter drug. The over -the-counter products contain the same active ingredients as the prescription product at the same, or similar, strengths.   I called [facility] to see if he had tried any other PPI's- he states nothing other than the OTC omeprazole 20mg  -BID, and Omeprazole 40mg -QD. Please advise as to which PPI we can send in for pt.

## 2020-04-16 MED ORDER — DEXLANSOPRAZOLE 30 MG PO CPDR: 30.0000 mg | DELAYED_RELEASE_CAPSULE | Freq: Every day | ORAL | 2 refills | Status: AC

## 2020-04-16 NOTE — Telephone Encounter (Signed)
Dexilant 30mg  once daily has been sent to [facility] pharmacy. Pt has been advised. Also pt would like to schedule his follow-up appt. I advise pt to call back mid to late next week and we should have Sept/Oct 2021 office schedule available.

## 2020-04-16 NOTE — Telephone Encounter (Signed)
They will  Cover any prescription PPI as long as it is not equivalent to a PPI that can be bought over the counter.

## 2020-04-16 NOTE — Telephone Encounter (Signed)
Please substitute with Dexilant 30 mg once daily. Thank you.

## 2020-04-27 DIAGNOSIS — Z20828 Contact with and (suspected) exposure to other viral communicable diseases: Secondary | ICD-10-CM | POA: Diagnosis not present

## 2020-06-11 DIAGNOSIS — R8289 Other abnormal findings on cytological and histological examination of urine: Secondary | ICD-10-CM | POA: Diagnosis not present

## 2020-06-11 DIAGNOSIS — R300 Dysuria: Secondary | ICD-10-CM | POA: Diagnosis not present

## 2020-10-28 DIAGNOSIS — F331 Major depressive disorder, recurrent, moderate: Secondary | ICD-10-CM | POA: Diagnosis not present

## 2020-10-29 DIAGNOSIS — Z23 Encounter for immunization: Secondary | ICD-10-CM | POA: Diagnosis not present

## 2020-10-29 DIAGNOSIS — Z113 Encounter for screening for infections with a predominantly sexual mode of transmission: Secondary | ICD-10-CM | POA: Diagnosis not present

## 2020-11-04 DIAGNOSIS — F331 Major depressive disorder, recurrent, moderate: Secondary | ICD-10-CM | POA: Diagnosis not present

## 2020-11-11 DIAGNOSIS — F331 Major depressive disorder, recurrent, moderate: Secondary | ICD-10-CM | POA: Diagnosis not present

## 2020-11-25 DIAGNOSIS — F331 Major depressive disorder, recurrent, moderate: Secondary | ICD-10-CM | POA: Diagnosis not present

## 2020-12-02 DIAGNOSIS — F331 Major depressive disorder, recurrent, moderate: Secondary | ICD-10-CM | POA: Diagnosis not present

## 2020-12-16 DIAGNOSIS — F331 Major depressive disorder, recurrent, moderate: Secondary | ICD-10-CM | POA: Diagnosis not present

## 2020-12-23 DIAGNOSIS — F331 Major depressive disorder, recurrent, moderate: Secondary | ICD-10-CM | POA: Diagnosis not present

## 2020-12-30 DIAGNOSIS — F331 Major depressive disorder, recurrent, moderate: Secondary | ICD-10-CM | POA: Diagnosis not present

## 2021-01-06 DIAGNOSIS — F331 Major depressive disorder, recurrent, moderate: Secondary | ICD-10-CM | POA: Diagnosis not present

## 2021-01-20 DIAGNOSIS — F331 Major depressive disorder, recurrent, moderate: Secondary | ICD-10-CM | POA: Diagnosis not present

## 2021-01-27 DIAGNOSIS — F331 Major depressive disorder, recurrent, moderate: Secondary | ICD-10-CM | POA: Diagnosis not present

## 2021-02-03 DIAGNOSIS — F331 Major depressive disorder, recurrent, moderate: Secondary | ICD-10-CM | POA: Diagnosis not present

## 2021-02-10 DIAGNOSIS — F331 Major depressive disorder, recurrent, moderate: Secondary | ICD-10-CM | POA: Diagnosis not present

## 2021-02-17 DIAGNOSIS — F331 Major depressive disorder, recurrent, moderate: Secondary | ICD-10-CM | POA: Diagnosis not present

## 2021-02-24 DIAGNOSIS — F331 Major depressive disorder, recurrent, moderate: Secondary | ICD-10-CM | POA: Diagnosis not present

## 2021-03-03 DIAGNOSIS — F331 Major depressive disorder, recurrent, moderate: Secondary | ICD-10-CM | POA: Diagnosis not present

## 2021-03-09 DIAGNOSIS — H00015 Hordeolum externum left lower eyelid: Secondary | ICD-10-CM | POA: Diagnosis not present

## 2021-03-09 DIAGNOSIS — H109 Unspecified conjunctivitis: Secondary | ICD-10-CM | POA: Diagnosis not present

## 2021-03-10 DIAGNOSIS — F331 Major depressive disorder, recurrent, moderate: Secondary | ICD-10-CM | POA: Diagnosis not present

## 2021-03-17 DIAGNOSIS — F331 Major depressive disorder, recurrent, moderate: Secondary | ICD-10-CM | POA: Diagnosis not present

## 2021-03-23 DIAGNOSIS — S61452A Open bite of left hand, initial encounter: Secondary | ICD-10-CM | POA: Diagnosis not present

## 2021-03-23 DIAGNOSIS — W540XXA Bitten by dog, initial encounter: Secondary | ICD-10-CM | POA: Diagnosis not present

## 2021-03-24 DIAGNOSIS — F331 Major depressive disorder, recurrent, moderate: Secondary | ICD-10-CM | POA: Diagnosis not present

## 2021-03-31 DIAGNOSIS — F331 Major depressive disorder, recurrent, moderate: Secondary | ICD-10-CM | POA: Diagnosis not present

## 2021-04-07 DIAGNOSIS — F331 Major depressive disorder, recurrent, moderate: Secondary | ICD-10-CM | POA: Diagnosis not present

## 2021-04-14 DIAGNOSIS — F331 Major depressive disorder, recurrent, moderate: Secondary | ICD-10-CM | POA: Diagnosis not present

## 2021-04-21 DIAGNOSIS — F331 Major depressive disorder, recurrent, moderate: Secondary | ICD-10-CM | POA: Diagnosis not present

## 2021-04-28 DIAGNOSIS — F331 Major depressive disorder, recurrent, moderate: Secondary | ICD-10-CM | POA: Diagnosis not present

## 2021-05-05 DIAGNOSIS — F331 Major depressive disorder, recurrent, moderate: Secondary | ICD-10-CM | POA: Diagnosis not present

## 2021-05-12 DIAGNOSIS — F331 Major depressive disorder, recurrent, moderate: Secondary | ICD-10-CM | POA: Diagnosis not present

## 2021-05-26 DIAGNOSIS — F331 Major depressive disorder, recurrent, moderate: Secondary | ICD-10-CM | POA: Diagnosis not present

## 2021-06-02 DIAGNOSIS — F331 Major depressive disorder, recurrent, moderate: Secondary | ICD-10-CM | POA: Diagnosis not present

## 2021-06-16 DIAGNOSIS — F331 Major depressive disorder, recurrent, moderate: Secondary | ICD-10-CM | POA: Diagnosis not present

## 2021-06-23 DIAGNOSIS — F331 Major depressive disorder, recurrent, moderate: Secondary | ICD-10-CM | POA: Diagnosis not present

## 2021-06-30 DIAGNOSIS — F331 Major depressive disorder, recurrent, moderate: Secondary | ICD-10-CM | POA: Diagnosis not present

## 2021-07-07 DIAGNOSIS — F331 Major depressive disorder, recurrent, moderate: Secondary | ICD-10-CM | POA: Diagnosis not present

## 2021-07-21 DIAGNOSIS — F331 Major depressive disorder, recurrent, moderate: Secondary | ICD-10-CM | POA: Diagnosis not present

## 2021-07-28 DIAGNOSIS — F331 Major depressive disorder, recurrent, moderate: Secondary | ICD-10-CM | POA: Diagnosis not present

## 2021-08-04 DIAGNOSIS — A492 Hemophilus influenzae infection, unspecified site: Secondary | ICD-10-CM | POA: Diagnosis not present

## 2021-08-11 DIAGNOSIS — F331 Major depressive disorder, recurrent, moderate: Secondary | ICD-10-CM | POA: Diagnosis not present

## 2021-08-18 DIAGNOSIS — F331 Major depressive disorder, recurrent, moderate: Secondary | ICD-10-CM | POA: Diagnosis not present

## 2021-08-25 DIAGNOSIS — F331 Major depressive disorder, recurrent, moderate: Secondary | ICD-10-CM | POA: Diagnosis not present

## 2021-09-01 DIAGNOSIS — F331 Major depressive disorder, recurrent, moderate: Secondary | ICD-10-CM | POA: Diagnosis not present

## 2021-09-08 DIAGNOSIS — F331 Major depressive disorder, recurrent, moderate: Secondary | ICD-10-CM | POA: Diagnosis not present

## 2021-09-22 DIAGNOSIS — F331 Major depressive disorder, recurrent, moderate: Secondary | ICD-10-CM | POA: Diagnosis not present

## 2021-09-29 DIAGNOSIS — F331 Major depressive disorder, recurrent, moderate: Secondary | ICD-10-CM | POA: Diagnosis not present

## 2021-10-06 DIAGNOSIS — F331 Major depressive disorder, recurrent, moderate: Secondary | ICD-10-CM | POA: Diagnosis not present

## 2021-10-13 DIAGNOSIS — F331 Major depressive disorder, recurrent, moderate: Secondary | ICD-10-CM | POA: Diagnosis not present

## 2021-10-20 DIAGNOSIS — F331 Major depressive disorder, recurrent, moderate: Secondary | ICD-10-CM | POA: Diagnosis not present

## 2021-10-27 DIAGNOSIS — F331 Major depressive disorder, recurrent, moderate: Secondary | ICD-10-CM | POA: Diagnosis not present

## 2021-11-10 DIAGNOSIS — F331 Major depressive disorder, recurrent, moderate: Secondary | ICD-10-CM | POA: Diagnosis not present

## 2021-11-19 DIAGNOSIS — F331 Major depressive disorder, recurrent, moderate: Secondary | ICD-10-CM | POA: Diagnosis not present

## 2021-11-24 DIAGNOSIS — F331 Major depressive disorder, recurrent, moderate: Secondary | ICD-10-CM | POA: Diagnosis not present

## 2021-12-01 DIAGNOSIS — F331 Major depressive disorder, recurrent, moderate: Secondary | ICD-10-CM | POA: Diagnosis not present

## 2021-12-08 DIAGNOSIS — F331 Major depressive disorder, recurrent, moderate: Secondary | ICD-10-CM | POA: Diagnosis not present

## 2021-12-15 DIAGNOSIS — F331 Major depressive disorder, recurrent, moderate: Secondary | ICD-10-CM | POA: Diagnosis not present

## 2021-12-22 DIAGNOSIS — F331 Major depressive disorder, recurrent, moderate: Secondary | ICD-10-CM | POA: Diagnosis not present

## 2021-12-29 DIAGNOSIS — F331 Major depressive disorder, recurrent, moderate: Secondary | ICD-10-CM | POA: Diagnosis not present

## 2022-01-05 DIAGNOSIS — F331 Major depressive disorder, recurrent, moderate: Secondary | ICD-10-CM | POA: Diagnosis not present

## 2022-01-19 DIAGNOSIS — F331 Major depressive disorder, recurrent, moderate: Secondary | ICD-10-CM | POA: Diagnosis not present

## 2022-01-26 DIAGNOSIS — F331 Major depressive disorder, recurrent, moderate: Secondary | ICD-10-CM | POA: Diagnosis not present

## 2022-02-02 DIAGNOSIS — F331 Major depressive disorder, recurrent, moderate: Secondary | ICD-10-CM | POA: Diagnosis not present

## 2022-02-09 DIAGNOSIS — F331 Major depressive disorder, recurrent, moderate: Secondary | ICD-10-CM | POA: Diagnosis not present

## 2022-02-16 DIAGNOSIS — F331 Major depressive disorder, recurrent, moderate: Secondary | ICD-10-CM | POA: Diagnosis not present

## 2022-02-18 DIAGNOSIS — Z113 Encounter for screening for infections with a predominantly sexual mode of transmission: Secondary | ICD-10-CM | POA: Diagnosis not present

## 2022-02-23 DIAGNOSIS — F331 Major depressive disorder, recurrent, moderate: Secondary | ICD-10-CM | POA: Diagnosis not present

## 2022-03-02 DIAGNOSIS — F331 Major depressive disorder, recurrent, moderate: Secondary | ICD-10-CM | POA: Diagnosis not present

## 2022-03-09 DIAGNOSIS — F331 Major depressive disorder, recurrent, moderate: Secondary | ICD-10-CM | POA: Diagnosis not present

## 2022-03-23 DIAGNOSIS — F331 Major depressive disorder, recurrent, moderate: Secondary | ICD-10-CM | POA: Diagnosis not present

## 2022-03-30 DIAGNOSIS — F331 Major depressive disorder, recurrent, moderate: Secondary | ICD-10-CM | POA: Diagnosis not present

## 2022-04-13 DIAGNOSIS — F331 Major depressive disorder, recurrent, moderate: Secondary | ICD-10-CM | POA: Diagnosis not present

## 2022-04-20 DIAGNOSIS — F331 Major depressive disorder, recurrent, moderate: Secondary | ICD-10-CM | POA: Diagnosis not present

## 2022-05-04 DIAGNOSIS — F331 Major depressive disorder, recurrent, moderate: Secondary | ICD-10-CM | POA: Diagnosis not present

## 2022-05-11 DIAGNOSIS — F331 Major depressive disorder, recurrent, moderate: Secondary | ICD-10-CM | POA: Diagnosis not present

## 2022-05-14 DIAGNOSIS — Z6824 Body mass index (BMI) 24.0-24.9, adult: Secondary | ICD-10-CM | POA: Diagnosis not present

## 2022-05-14 DIAGNOSIS — Z20822 Contact with and (suspected) exposure to covid-19: Secondary | ICD-10-CM | POA: Diagnosis not present

## 2022-05-14 DIAGNOSIS — Z23 Encounter for immunization: Secondary | ICD-10-CM | POA: Diagnosis not present

## 2022-05-14 DIAGNOSIS — Z03818 Encounter for observation for suspected exposure to other biological agents ruled out: Secondary | ICD-10-CM | POA: Diagnosis not present

## 2022-05-14 DIAGNOSIS — J069 Acute upper respiratory infection, unspecified: Secondary | ICD-10-CM | POA: Diagnosis not present

## 2022-05-18 DIAGNOSIS — F331 Major depressive disorder, recurrent, moderate: Secondary | ICD-10-CM | POA: Diagnosis not present

## 2022-05-25 DIAGNOSIS — F331 Major depressive disorder, recurrent, moderate: Secondary | ICD-10-CM | POA: Diagnosis not present

## 2022-06-01 DIAGNOSIS — F331 Major depressive disorder, recurrent, moderate: Secondary | ICD-10-CM | POA: Diagnosis not present

## 2022-06-15 DIAGNOSIS — F331 Major depressive disorder, recurrent, moderate: Secondary | ICD-10-CM | POA: Diagnosis not present

## 2022-06-25 DIAGNOSIS — J45909 Unspecified asthma, uncomplicated: Secondary | ICD-10-CM | POA: Diagnosis not present

## 2022-06-29 DIAGNOSIS — F331 Major depressive disorder, recurrent, moderate: Secondary | ICD-10-CM | POA: Diagnosis not present

## 2022-07-07 DIAGNOSIS — F331 Major depressive disorder, recurrent, moderate: Secondary | ICD-10-CM | POA: Diagnosis not present

## 2022-07-20 DIAGNOSIS — F331 Major depressive disorder, recurrent, moderate: Secondary | ICD-10-CM | POA: Diagnosis not present

## 2022-07-21 DIAGNOSIS — S60221A Contusion of right hand, initial encounter: Secondary | ICD-10-CM | POA: Diagnosis not present

## 2022-07-27 DIAGNOSIS — F331 Major depressive disorder, recurrent, moderate: Secondary | ICD-10-CM | POA: Diagnosis not present

## 2022-08-03 ENCOUNTER — Encounter: Payer: Self-pay | Admitting: Internal Medicine

## 2022-08-03 DIAGNOSIS — F331 Major depressive disorder, recurrent, moderate: Secondary | ICD-10-CM | POA: Diagnosis not present

## 2022-08-09 DIAGNOSIS — F331 Major depressive disorder, recurrent, moderate: Secondary | ICD-10-CM | POA: Diagnosis not present

## 2022-08-17 DIAGNOSIS — F331 Major depressive disorder, recurrent, moderate: Secondary | ICD-10-CM | POA: Diagnosis not present

## 2022-08-24 DIAGNOSIS — F331 Major depressive disorder, recurrent, moderate: Secondary | ICD-10-CM | POA: Diagnosis not present

## 2022-08-31 DIAGNOSIS — F331 Major depressive disorder, recurrent, moderate: Secondary | ICD-10-CM | POA: Diagnosis not present

## 2022-09-06 DIAGNOSIS — Z113 Encounter for screening for infections with a predominantly sexual mode of transmission: Secondary | ICD-10-CM | POA: Diagnosis not present

## 2022-09-07 DIAGNOSIS — F331 Major depressive disorder, recurrent, moderate: Secondary | ICD-10-CM | POA: Diagnosis not present
